# Patient Record
Sex: Female | Born: 1985 | Race: White | Hispanic: No | Marital: Single | Smoking: Former smoker
Health system: Southern US, Community
[De-identification: ages and names within clinical notes are randomized; demographics above are authoritative.]

## PROBLEM LIST (undated history)

## (undated) DIAGNOSIS — F988 Other specified behavioral and emotional disorders with onset usually occurring in childhood and adolescence: Secondary | ICD-10-CM

## (undated) DIAGNOSIS — K219 Gastro-esophageal reflux disease without esophagitis: Secondary | ICD-10-CM

## (undated) HISTORY — DX: Gastro-esophageal reflux disease without esophagitis: K21.9

## (undated) HISTORY — PX: TONSILLECTOMY: SUR1361

## (undated) HISTORY — DX: Other specified behavioral and emotional disorders with onset usually occurring in childhood and adolescence: F98.8

---

## 2006-10-09 ENCOUNTER — Emergency Department (HOSPITAL_COMMUNITY): Admission: EM | Admit: 2006-10-09 | Discharge: 2006-10-09 | Payer: Self-pay | Admitting: Emergency Medicine

## 2009-09-03 ENCOUNTER — Encounter (INDEPENDENT_AMBULATORY_CARE_PROVIDER_SITE_OTHER): Payer: Self-pay | Admitting: *Deleted

## 2009-09-17 ENCOUNTER — Telehealth: Payer: Self-pay | Admitting: Gastroenterology

## 2009-09-22 ENCOUNTER — Encounter: Payer: Self-pay | Admitting: Gastroenterology

## 2009-09-22 ENCOUNTER — Encounter: Payer: Self-pay | Admitting: Nurse Practitioner

## 2009-09-22 ENCOUNTER — Ambulatory Visit: Payer: Self-pay | Admitting: Internal Medicine

## 2009-09-22 DIAGNOSIS — R634 Abnormal weight loss: Secondary | ICD-10-CM | POA: Insufficient documentation

## 2009-09-22 DIAGNOSIS — R112 Nausea with vomiting, unspecified: Secondary | ICD-10-CM | POA: Insufficient documentation

## 2009-09-22 DIAGNOSIS — R519 Headache, unspecified: Secondary | ICD-10-CM | POA: Insufficient documentation

## 2009-09-22 DIAGNOSIS — R197 Diarrhea, unspecified: Secondary | ICD-10-CM | POA: Insufficient documentation

## 2009-09-22 DIAGNOSIS — R1084 Generalized abdominal pain: Secondary | ICD-10-CM | POA: Insufficient documentation

## 2009-09-22 DIAGNOSIS — K219 Gastro-esophageal reflux disease without esophagitis: Secondary | ICD-10-CM | POA: Insufficient documentation

## 2009-09-22 DIAGNOSIS — R51 Headache: Secondary | ICD-10-CM | POA: Insufficient documentation

## 2009-09-22 LAB — CONVERTED CEMR LAB: Tissue Transglutaminase Ab, IgA: 9.9 units (ref <20)

## 2009-09-24 ENCOUNTER — Encounter: Payer: Self-pay | Admitting: Nurse Practitioner

## 2009-09-28 ENCOUNTER — Telehealth: Payer: Self-pay | Admitting: Nurse Practitioner

## 2009-09-29 ENCOUNTER — Telehealth: Payer: Self-pay | Admitting: Gastroenterology

## 2009-10-02 ENCOUNTER — Ambulatory Visit (HOSPITAL_COMMUNITY): Admission: RE | Admit: 2009-10-02 | Discharge: 2009-10-02 | Payer: Self-pay | Admitting: Gastroenterology

## 2009-10-07 ENCOUNTER — Ambulatory Visit: Payer: Self-pay | Admitting: Gastroenterology

## 2009-10-12 ENCOUNTER — Encounter (INDEPENDENT_AMBULATORY_CARE_PROVIDER_SITE_OTHER): Payer: Self-pay | Admitting: *Deleted

## 2009-10-15 ENCOUNTER — Encounter (INDEPENDENT_AMBULATORY_CARE_PROVIDER_SITE_OTHER): Payer: Self-pay | Admitting: *Deleted

## 2009-10-19 ENCOUNTER — Telehealth: Payer: Self-pay | Admitting: Gastroenterology

## 2009-10-19 ENCOUNTER — Ambulatory Visit: Payer: Self-pay | Admitting: Gastroenterology

## 2009-10-27 ENCOUNTER — Ambulatory Visit: Payer: Self-pay | Admitting: Gastroenterology

## 2009-11-04 ENCOUNTER — Ambulatory Visit: Payer: Self-pay | Admitting: Gastroenterology

## 2009-11-05 ENCOUNTER — Ambulatory Visit: Payer: Self-pay | Admitting: Cardiology

## 2009-11-05 LAB — CONVERTED CEMR LAB
ALT: 12 units/L (ref 0–35)
AST: 18 units/L (ref 0–37)
Basophils Absolute: 0 10*3/uL (ref 0.0–0.1)
Creatinine, Ser: 0.7 mg/dL (ref 0.4–1.2)
Eosinophils Absolute: 0 10*3/uL (ref 0.0–0.7)
HCT: 42.4 % (ref 36.0–46.0)
Hemoglobin: 14.8 g/dL (ref 12.0–15.0)
Lymphs Abs: 1.8 10*3/uL (ref 0.7–4.0)
MCHC: 34.9 g/dL (ref 30.0–36.0)
MCV: 92.3 fL (ref 78.0–100.0)
Monocytes Absolute: 0.3 10*3/uL (ref 0.1–1.0)
Neutro Abs: 2 10*3/uL (ref 1.4–7.7)
RDW: 12.9 % (ref 11.5–14.6)
Sodium: 141 meq/L (ref 135–145)
Total Bilirubin: 0.5 mg/dL (ref 0.3–1.2)

## 2009-12-08 ENCOUNTER — Encounter: Payer: Self-pay | Admitting: Gastroenterology

## 2010-02-02 NOTE — Letter (Signed)
Summary: [facility] Instructions  [facility]  [address] [place], [place] [address]   Phone: [phone]  Fax: [phone]       Carol Cain    08-20-1985    MRN: [id number]        Procedure Day /Date:  Tuesday 10/27/2009     Arrival Time: 10:00 am     Procedure Time: 11:00 am     Location of Procedure:                    _x _  [facility] (4th Floor)                        PREPARATION FOR COLONOSCOPY WITH MOVIPREP   Starting 5 days prior to your procedure Thursday 10/20 do not eat nuts, seeds, popcorn, corn, beans, peas,  salads, or any raw vegetables.  Do not take any fiber supplements (e.g. Metamucil, Citrucel, and Benefiber).  THE DAY BEFORE YOUR PROCEDURE         DATE: Monday 10/24  1.  Drink clear liquids the entire day-NO SOLID FOOD  2.  Do not drink anything colored red or purple.  Avoid juices with pulp.  No orange juice.  3.  Drink at least 64 oz. (8 glasses) of fluid/clear liquids during the day to prevent dehydration and help the prep work efficiently.  CLEAR LIQUIDS INCLUDE: Water Jello Ice Popsicles Tea (sugar ok, no milk/cream) Powdered fruit flavored drinks Coffee (sugar ok, no milk/cream) Gatorade Juice: apple, white grape, white cranberry  Lemonade Clear bullion, consomm, broth Carbonated beverages (any kind) Strained chicken noodle soup Hard Candy                             4.  In the morning, mix first dose of MoviPrep solution:    Empty 1 Pouch A and 1 Pouch B into the disposable container    Add lukewarm drinking water to the top line of the container. Mix to dissolve    Refrigerate (mixed solution should be used within 24 hrs)  5.  Begin drinking the prep at 5:00 p.m. The MoviPrep container is divided by 4 marks.   Every 15 minutes drink the solution down to the next mark (approximately 8 oz) until the full liter is complete.   6.  Follow completed prep with 16 oz of clear liquid of your choice  (Nothing red or purple).  Continue to drink clear liquids until bedtime.  7.  Before going to bed, mix second dose of MoviPrep solution:    Empty 1 Pouch A and 1 Pouch B into the disposable container    Add lukewarm drinking water to the top line of the container. Mix to dissolve    Refrigerate  THE DAY OF YOUR PROCEDURE      DATE: Thursday 10/25  Beginning at 6:00 a.m. (5 hours before procedure):         1. Every 15 minutes, drink the solution down to the next mark (approx 8 oz) until the full liter is complete.  2. Follow completed prep with 16 oz. of clear liquid of your choice.    3. You may drink clear liquids until 9:00 am (2 HOURS BEFORE PROCEDURE).   MEDICATION INSTRUCTIONS  Unless otherwise instructed, you should take regular prescription medications with a small sip of water   as early as possible the morning of your  procedure.           OTHER INSTRUCTIONS  You will need a responsible adult at least 25 years of age to accompany you and drive you home.   This person must remain in the waiting room during your procedure.  Wear loose fitting clothing that is easily removed.  Leave jewelry and other valuables at home.  However, you may wish to bring a book to read or  an iPod/MP3 player to listen to music as you wait for your procedure to start.  Remove all body piercing jewelry and leave at home.  Total time from sign-in until discharge is approximately 2-3 hours.  You should go home directly after your procedure and rest.  You can resume normal activities the  day after your procedure.  The day of your procedure you should not:   Drive   Make legal decisions   Operate machinery   Drink alcohol   Return to work  You will receive specific instructions about eating, activities and medications before you leave.    The above instructions have been reviewed and explained to me by   Ezra Sites RN  October 19, 2009 1:16 PM   I fully understand and  can verbalize these instructions _____________________________ Date _________

## 2010-02-02 NOTE — Letter (Signed)
Summary: Out of [facility]  [address] [place], [place] [address]   Phone: [phone]  Fax: [phone]    September 22, 2009   Student:  Maurine Minister    To Whom It May Concern:   For Medical reasons, please excuse the above named student from school for the following dates:  Start:   September 22, 2009  This Student saw Willette Cluster RNP at [facility].     If you need additional information, please feel free to contact our office.   Sincerely,           ****This is a legal document and cannot be tampered with.  Schools are authorized to verify all information and to do so accordingly.

## 2010-02-02 NOTE — Letter (Signed)
Summary: Previsit letter  [facility]  [address] [place], [place] [address]   Phone: [phone]  Fax: [phone]       10/12/2009 MRN: [id number]  Carol Cain [address] [place], [place]  [address]  Dear Ms. Picking,  Welcome to the Gastroenterology Division at [facility].    You are scheduled to see a nurse for your pre-procedure visit on 10/19/09 at 1pm  on the 3rd floor at [facility], [address]  We ask that you try to arrive at our office 15 minutes prior to your appointment time to allow for check-in.  Your nurse visit will consist of discussing your medical and surgical history, your immediate family medical history, and your medications.    Please bring a complete list of all your medications or, if you prefer, bring the medication bottles and we will list them.  We will need to be aware of both prescribed and over the counter drugs.  We will need to know exact dosage information as well.  If you are on blood thinners (Coumadin, Plavix, Aggrenox, Ticlid, etc.) please call our office today/prior to your appointment, as we need to consult with your physician about holding your medication.   Please be prepared to read and sign documents such as consent forms, a financial agreement, and acknowledgement forms.  If necessary, and with your consent, a friend or relative is welcome to sit-in on the nurse visit with you.  Please bring your insurance card so that we may make a copy of it.  If your insurance requires a referral to see a specialist, please bring your referral form from your primary care physician.  No co-pay is required for this nurse visit.     If you cannot keep your appointment, please call [phone] to cancel or reschedule prior to your appointment date.  This allows [place] the opportunity to schedule an appointment for another patient in need of care.    Thank you for choosing [facility] for your medical  needs.  We appreciate the opportunity to care for you.  Please visit [place] at our website  to learn more about our practice.                     Sincerely.                                                                                                                   The Gastroenterology Division

## 2010-02-02 NOTE — Progress Notes (Signed)
Summary: triage  Phone Note Call from Patient Call back at Home Phone [phone]   Caller: Patient Call For: Dr. Christella Hartigan Reason for Call: Talk to Nurse Summary of Call: pt symptoms have worsened and she would like to be seen sooner Initial call taken by: Vallarie Mare,  September 17, 2009 10:06 AM  Follow-up for Phone Call        Vomiting and diarrhea, all day nausea, abd pain and sweating. x 3 weeks.  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  She has been told not to come to class because of this.  Pt offered appt with Amy on 09/21/09 but she was unable to make this so she was put on with [facility] on 09/22/09. Follow-up by: Chales Abrahams CMA Duncan Dull),  September 17, 2009 10:54 AM

## 2010-02-02 NOTE — Miscellaneous (Signed)
Summary: [facility]  Clinical Lists Changes  Medications: Added new medication of MOVIPREP 100 GM  SOLR (PEG-KCL-NACL-NASULF-NA ASC-C) As per prep instructions. - Signed Rx of MOVIPREP 100 GM  SOLR (PEG-KCL-NACL-NASULF-NA ASC-C) As per prep instructions.;  #1 x 0;  Signed;  Entered by: Ezra Sites RN;  Authorized by: Rachael Fee MD;  Method used: Electronically to [facility]. #[id number]*, [address] [place], [place], [place]  [address], Ph: [phone], Fax: [phone] Observations: Added new observation of NKA: T (10/19/2009 12:57)    Prescriptions: MOVIPREP 100 GM  SOLR (PEG-KCL-NACL-NASULF-NA ASC-C) As per prep instructions.  #1 x 0   Entered by:   Ezra Sites RN   Authorized by:   Rachael Fee MD   Signed by:   Ezra Sites RN on 10/19/2009   Method used:   Electronically to        [facility]. [phone]* (retail)       [phone] [address].       [place], [place]  [address]       Ph: [phone]       Fax: [phone]   RxID:   [id number]

## 2010-02-02 NOTE — Letter (Signed)
Summary: New Patient letter  [facility]  [address] [place], [place] [address]   Phone: [phone]  Fax: [phone]       09/03/2009 MRN: [id number]  Carol Cain [address] [place], [place]  [address]  Dear Ms. Mecca,  Welcome to the Gastroenterology Division at [facility].    You are scheduled to see Dr. Christella Hartigan on 10/16/2009 at 11:00AM on the 3rd floor at [facility], [address].  We ask that you try to arrive at our office 15 minutes prior to your appointment time to allow for check-in.  We would like you to complete the enclosed self-administered evaluation form prior to your visit and bring it with you on the day of your appointment.  We will review it with you.  Also, please bring a complete list of all your medications or, if you prefer, bring the medication bottles and we will list them.  Please bring your insurance card so that we may make a copy of it.  If your insurance requires a referral to see a specialist, please bring your referral form from your primary care physician.  Co-payments are due at the time of your visit and may be paid by cash, check or credit card.     Your office visit will consist of a consult with your physician (includes a physical exam), any laboratory testing he/she may order, scheduling of any necessary diagnostic testing (e.g. x-Carol, ultrasound, CT-scan), and scheduling of a procedure (e.g. Endoscopy, Colonoscopy) if required.  Please allow enough time on your schedule to allow for any/all of these possibilities.    If you cannot keep your appointment, please call [phone] to cancel or reschedule prior to your appointment date.  This allows [place] the opportunity to schedule an appointment for another patient in need of care.  If you do not cancel or reschedule by 5 p.m. the business day prior to your appointment date, you will be charged a $50.00 late cancellation/no-show fee.    Thank you for choosing [facility] for your medical needs.  We appreciate the opportunity to care for you.  Please visit [place] at our website  to learn more about our practice.                     Sincerely,                                                             The Gastroenterology Division

## 2010-02-02 NOTE — Progress Notes (Signed)
Summary: Prescritions to [facility] Note Outgoing Call   Call placed by: Carol Cain,  September 28, 2009 12:14 PM Call placed to: Patient Summary of Call: Called the pt to ask her about taking a home pregnancy test.  She said has very  recently taken 3 tests due to wondering if she was pregnant. She says she is not and it has been 25 days since her last menses.  She expects her period this week.  Carol Cain RNP prescribed today for Cipro 500 BID x 7 days and Flagyl 500  two times a day x 7 days.  Pt wants me to send the prescriptiions to [facility], [employer]. [place] [facility] [place]).  I asked her to please call me the end of the week to let us know how she is doing. Initial call taken by: Carol Cain,  September 28, 2009 12:19 PM  Follow-up for Phone Call        Pregnancy test done very recently x 3.  The pt says she is not pregnant.  She had wondered herself since getting nauseated at certain smells and being nauseated.  She expects her period this week. Follow-up by: Carol Cain,  September 28, 2009 12:23 PM  Additional Follow-up for Phone Call Additional follow up Details #1::        Ok. Celiac panel and stool tests negative but given ongoing diarrhea, frequent exposure to pigs, ducks, etc... will try course of antibiotics for diarrhea. She is scheduled for EGD for the nausea and vomiting.  Additional Follow-up by: Carol Cluster NP,  September 29, 2009 7:42 AM    New/Updated Medications: CIPRO 500 MG TABS (CIPROFLOXACIN HCL) Take 1 tab twice daily x 7 days FLAGYL 500 MG TABS (METRONIDAZOLE) Take 1 tab twice daily x 7 days Prescriptions: FLAGYL 500 MG TABS (METRONIDAZOLE) Take 1 tab twice daily x 7 days  #14 x 0   Entered by:   Lowry Ram NCMA   Authorized by:   Carol Cluster NP   Signed by:   Lowry Ram NCMA on 09/28/2009   Method used:   Electronically to        [facility]. [phone]* (retail)       [phone]  [address].       [place], [place]  [address]       Ph: [phone]       Fax: [phone]   RxID:   [id number] CIPRO 500 MG TABS (CIPROFLOXACIN HCL) Take 1 tab twice daily x 7 days  #14 x 0   Entered by:   Lowry Ram NCMA   Authorized by:   Carol Cluster NP   Signed by:   Lowry Ram NCMA on 09/28/2009   Method used:   Electronically to        [facility]. [phone]* (retail)       [phone] [address].       [place], [place]  [address]       Ph: [phone]       Fax: [phone]   RxID:   [id number]

## 2010-02-02 NOTE — Progress Notes (Signed)
Summary: Gi Issues discuss with nurse.  Phone Note Call from Patient Call back at [facility] Phone [phone]   Call For: Dr Christella Hartigan Reason for Call: Talk to Nurse Summary of Call: Having some GI issues she would like to discuss with nurse. Initial call taken by: Leanor Kail [facility],  October 19, 2009 1:26 PM  Follow-up for Phone Call        left message on machine to call back Chales Abrahams CMA Duncan Dull)  October 19, 2009 1:29 PM   pt having rectal bleeding/ burning  with every bowel movement  .  Also, having blisters in the mouth. Having Colon on 10/27/09.  Pt has been doing sitz baths daily as much as possible but she is worried now because of the blisters she is not sure if this is related or not. Advised to keep colon appt and to call if new symptoms arise.  Dr Juanda Chance please advise if anything further needs to be done Follow-up by: Chales Abrahams CMA Duncan Dull),  October 19, 2009 2:04 PM  Additional Follow-up for Phone Call Additional follow up Details #1::        Rectak blisters likely not related to mouth blisters unless she has Type II Herpes..Suggest to use Nupricainal ointment three times a day and as needed rectal irritation. Dr Christella Hartigan will chech the lesions at colonoscopy. Additional Follow-up by: Hart Carwin MD,  October 19, 2009 4:43 PM    Additional Follow-up for Phone Call Additional follow up Details #2::    pt aware Follow-up by: Chales Abrahams CMA Duncan Dull),  October 19, 2009 4:53 PM

## 2010-02-02 NOTE — Procedures (Signed)
Summary: Upper Endoscopy  Patient: Carol Cain Note: All result statuses are Final unless otherwise noted.  Tests: (1) Upper Endoscopy (EGD)   EGD Upper Endoscopy       DONE     [facility]     [address]     [place], [place]  [address]           ENDOSCOPY PROCEDURE REPORT           PATIENT:  Carol Cain, Carol Cain  MR#:  [id number]     BIRTHDATE:  May 21, 1985, 23 yrs. old  GENDER:  female     ENDOSCOPIST:  Rachael Fee, MD     PROCEDURE DATE:  10/07/2009     PROCEDURE:  EGD with biopsy     ASA CLASS:  Class I     INDICATIONS:  nausea, vomiting, abd pain, diarrhea     MEDICATIONS:   Fentanyl 50 mcg IV, Versed 7 mg IV     TOPICAL ANESTHETIC:  Exactacain Spray     DESCRIPTION OF PROCEDURE:   After the risks benefits and     alternatives of the procedure were thoroughly explained, informed     consent was obtained.  The [facility] GIF-H180 [id number] endoscope was     introduced through the mouth and advanced to the second portion of     the duodenum, without limitations.  The instrument was slowly     withdrawn as the mucosa was fully examined.     <<PROCEDUREIMAGES>>     The upper, middle, and distal third of the esophagus were     carefully inspected and no abnormalities were noted. The z-line     was well seen at the GEJ. The endoscope was pushed into the fundus     which was normal including a retroflexed view. The antrum,gastric     body, first and second part of the duodenum were unremarkable.     Biopsies were taken from normal duodenum to check for Celiac Sprue     (pathology jar 1) (see image1, image2, image3, and image4).     Retroflexed views revealed no abnormalities.    The scope was then     withdrawn from the patient and the procedure completed.     COMPLICATIONS:  None           ENDOSCOPIC IMPRESSION:     1) Normal EGD; duodenum biopsied to check for Celiac Sprue     changes.           RECOMMENDATIONS:     Await final biopsy report.     If negative for celiac  sprue, would proceed with colonoscopy     (normal CBC, cmet, stool studies, no response to empiric trial of     Abx).           ______________________________     Rachael Fee, MD           n.     eSIGNED:   Rachael Fee at 10/07/2009 11:07 AM           Maurine Minister, [phone]  Note: An exclamation mark (!) indicates a result that was not dispersed into the flowsheet. Document Creation Date: 10/07/2009 11:07 AM _______________________________________________________________________  (1) Order result status: Final Collection or observation date-time: 10/07/2009 11:03 Requested date-time:  Receipt date-time:  Reported date-time:  Referring Physician:   Ordering Physician: Rob Bunting [id number]) Specimen Source:  Source: Launa Grill Order Number: [id number] Lab site:

## 2010-02-02 NOTE — Assessment & Plan Note (Signed)
Summary: diarrhea, vomiting, nausea, abd pain x 3 weeks/pl   History of Present Illness Visit Type: Initial Visit Primary GI MD: Rob Bunting MD Primary Provider: [facility] Requesting Provider: n/a Chief Complaint: Severe stomach cramps, diarhea and vomiting History of Present Illness:   Patient is a 25 year old female here for initial evaluation of multiple GI issues.  In addition she has multiple generalized complaints including, but not limited to, headaches, shortness of breath, night sweats, lower extremity swelling, cough, depression, fatigue, back pain, confusion, anxiety, and muscle pains. Patient accompanied by boyfriend who helps provide history. Has had a "sensitive stomach" (nausea, postprandial defection) since at least 2007when she was a vegan, which she no longer is. Her main problems began last summer and have progressed over the last few weeks. She complains of nausea, vomiting, diarrhea with urgency, frequent stomach cramps and weight loss of 30 pounds. Vomitus and stools contain excessive mucous. Recently started Lomotil and BMs have decreased from 5-6 a day to two BMs a day. Her abdominal cramps are diffuse and not relieved with defecation.  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 Worried about bacterial infections. Sometimes sees blood on tissue but anus is excoriated.   Evaluated for diarrhea at [facility] on 09/02/09, records reviewed and thyroid studies, CMP CBC all normal.   Longstanding GERD, takes Zantac three times a week.    GI Review of Systems    Reports abdominal pain, acid reflux, belching, bloating, chest pain, heartburn, loss of appetite, nausea, vomiting, and  weight loss.     Location of  Abdominal pain: gernalized.    Denies dysphagia with liquids, dysphagia with solids, vomiting blood, and  weight gain.      Reports change in bowel habits,  diarrhea, and  fecal incontinence.     Denies anal fissure, black tarry stools, constipation, diverticulosis, heme positive stool, hemorrhoids, irritable bowel syndrome, jaundice, light color stool, liver problems, rectal bleeding, and  rectal pain. Preventive Screening-Counseling & Management  Alcohol-Tobacco     Smoking Status: quit      Drug Use:  no.      Current Medications (verified): 1)  Zoloft 100 Mg Tabs (Sertraline Hcl) .Marland Kitchen.. 1 and 1/2 By Mouth in The Am 2)  Lomotil 2.5-0.025 Mg Tabs (Diphenoxylate-Atropine) .... 2 By Mouth Then 1 Tablet Every 8 Hours As Needed 3)  Ondansetron Hcl 4 Mg Tabs (Ondansetron Hcl) .... As Needed  Allergies (verified): No Known Drug Allergies  Past History:  Past Medical History: Anemia Anxiety Disorder Chronic Headaches Depression GERD Seizures Urinary Tract Infection  Past Surgical History: Unremarkable  Family History: No FH of Colon Cancer: Family History of Colon Polyps: Family History of Irritable Bowel Syndrome:  Social History: single student Patient is a former smoker.  Alcohol Use - no Daily Caffeine Use Illicit Drug Use - no Smoking Status:  quit Drug Use:  no  Review of Systems       The patient complains of allergy/sinus, anxiety-new, back pain, breast changes/lumps, confusion, cough, depression-new, fatigue, fever, headaches-new, menstrual pain, muscle pains/cramps, night sweats, shortness of breath, swelling of feet/legs, thirst - excessive, and urination - excessive.  The patient denies anemia, arthritis/joint pain, blood in urine, change in vision, coughing up blood, fainting, hearing problems, heart murmur, heart rhythm changes, itching, nosebleeds, skin rash, sleeping problems, sore throat, swollen lymph glands, thirst - excessive , urination -  excessive , urination changes/pain, urine leakage, vision changes, and voice change.    Vital Signs:  Patient profile:   25 year old female Height:      71  inches Weight:      173 pounds BMI:     24.22 BSA:     1.98 Pulse rate:   58 / minute Pulse rhythm:   regular BP sitting:   100 / 78  (left arm)  Vitals Entered By: Merri Ray CMA Duncan Dull) (September 22, 2009 1:40 PM)  Physical Exam  General:  Well developed, well nourished, no acute distress. Head:  Normocephalic and atraumatic. Eyes:  Conjunctiva pink, no icterus.  Mouth:  No oral lesions. Tongue moist.  Neck:  no obvious masses  Lungs:  Clear throughout to auscultation. Heart:  Regular rate and rhythm; no murmurs, rubs,  or bruits. Abdomen:  Abdomen soft, nondistended. Mild-moderate diffuse tenderness. No obvious masses or hepatomegaly.Normal bowel sounds.  Rectal:  A few superificial skin splits around anus. No other external or internal lesions. Scant stool in vaultl, heme negative Msk:  Symmetrical with no gross deformities. Normal posture. Extremities:  No palmar erythema, no edema.  Neurologic:  Alert and  oriented x4;  grossly normal neurologically. Skin:  Intact without significant lesions or rashes. Cervical Nodes:  No significant cervical adenopathy. Psych:  Alert and cooperative. Normal mood and affect.   Impression & Recommendations:  Problem # 1:  NAUSEA AND VOMITING (ICD-787.01) Assessment New Nausea, vomiting, diarrhea, abdominal cramps and excessive weight loss since June. Normal CBC, CMP within last two weeks. Patient looks fine, nothing terribly concerning on abdominal exam. 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 Rule out intestinal parasite. Rule of celiac disease ([place] ancestry). Her symptoms may be all functional. Diarrhea isn't profuse, suspect nausea and vomiting mostly responsible for excessive weight loss. For further evaluation the patient will be scheduled for an EGD with biopsies ( if indicated).  The risks and benefits of the procedure, as well as alternatives were discussed with the patient and she agrees to proceed. If above negative and symptoms  persist, may need colonscopy.    Orders: T-Sprue Panel (Celiac Disease Aby Eval) (83516x3/[address]) T-Culture, Stool (87045/87046-70140) T-Stool for O&P (16109-60454) T-Fecal WBC (09811-91478) EGD (EGD)  Problem # 2:  DIARRHEA (ICD-787.91) See #1.  Problem # 3:  ABDOMINAL PAIN -GENERALIZED (ICD-789.07) Assessment: New See #1. Orders: T-Sprue Panel (Celiac Disease Aby Eval) (83516x3/[address]) T-Culture, Stool (87045/[id number]) T-Stool for O&P (29562-13086) T-Fecal WBC (57846-96295) EGD (EGD)  Problem # 4:  GERD (ICD-530.81) Assessment: Comment Only Takes Zantac as needed. Orders: T-Sprue Panel (Celiac Disease Aby Eval) (83516x3/[address]) T-Culture, Stool (87045/87046-70140) T-Stool for O&P (28413-24401) T-Fecal WBC (02725-36644) EGD (EGD)  Problem # 5:  HEADACHE (ICD-784.0) Assessment: Comment Only Multiple generalized complaints listed in HPI. Will defer workup to PCP.  Patient Instructions: 1)  Please go to lab, basement level. 2)  We have given you samples of Prilosec 20 MG ( Omeprazole). 3)  We sent a prescription to your pharmacy [facility]. 4)  We scheduled the Endoscopy with Dr Marina Goodell for 10-07-09. 5)  Directions and brochure provided. 6)  [facility] Patient Information Guide given to patient. 7)  Copy sent to : [facility] 8)  The medication list was reviewed and reconciled.  All changed / newly prescribed medications were explained.  A complete medication list was provided to the patient / caregiver. Prescriptions: OMEPRAZOLE 20 MG CPDR (OMEPRAZOLE) Take 1 cap 30 min before breakfast  #30 x 3  Entered by:   Lowry Ram NCMA   Authorized by:   Willette Cluster NP   Signed by:   Lowry Ram NCMA on 09/22/2009   Method used:   Electronically to        [facility]. [phone]* (retail)       [phone]       [place], [place]  [address]       Ph: [phone]       Fax:  [phone]   RxID:   [id number] ONDANSETRON HCL 4 MG TABS (ONDANSETRON HCL) Take 1 tab every 4-6 hours as needed for nausea  #45 x 0   Entered by:   Lowry Ram NCMA   Authorized by:   Willette Cluster NP   Signed by:   Lowry Ram NCMA on 09/22/2009   Method used:   Electronically to        [facility]. [phone]* (retail)       [phone] [address]       [place], [place]  [address]       Ph: [phone]       Fax: [phone]   RxID:   [id number]

## 2010-02-02 NOTE — Procedures (Signed)
Summary: Colonoscopy  Patient: Carol Cain Note: All result statuses are Final unless otherwise noted.  Tests: (1) Colonoscopy (COL)   COL Colonoscopy           DONE     [facility]     [address]     [place], [place]  [address]           COLONOSCOPY PROCEDURE REPORT           PATIENT:  [name], [name]  MR#:  [id number]     BIRTHDATE:  Sep 21, 1985, 24 yrs. old  GENDER:  female     ENDOSCOPIST:  Rachael Fee, MD     PROCEDURE DATE:  10/27/2009     PROCEDURE:  Colonoscopy with biopsy     ASA CLASS:  Class I     INDICATIONS:  nausea, vomiting, abd pain, diarrhea (recent EGD,     stool tests, cmet, cbc, [place] were all normal)     MEDICATIONS:   Fentanyl 100 mcg IV, Versed 10 mg IV           DESCRIPTION OF PROCEDURE:   After the risks benefits and     alternatives of the procedure were thoroughly explained, informed     consent was obtained.  Digital rectal exam was performed and     revealed no rectal masses.   The LB [id number] endoscope     was introduced through the anus and advanced to the terminal ileum     which was intubated for a short distance, without limitations.     The quality of the prep was good, using MoviPrep.  The instrument     was then slowly withdrawn as the colon was fully examined.     <<PROCEDUREIMAGES>>     FINDINGS:  Terminal ileum had several shallow, linear ulcerations     however no other clear inflammatory changes. This was biopsied and     sent to pathology (jar 1) (see image3 and image4).  This was     otherwise a normal examination of the colon. Colon was randomly     biopsied (pathology jar 2) to check for microscopic colitis (see     image1 and image5).   Retroflexed views in the rectum revealed no     abnormalities.    The scope was then withdrawn from the patient     and the procedure completed.     COMPLICATIONS:  None           ENDOSCOPIC IMPRESSION:     1) Abnormal mucosa in terminal ileum, biopsied to check for  IBD.     Could be NSAID related.     2) Otherwise normal examination           RECOMMENDATIONS:     Await biopsy results.     For now, please start taking a single daily immodium shortly     after waking up and only stop this if you become constipated.           ______________________________     Rachael Fee, MD           n.     eSIGNED:   Rachael Fee at 10/27/2009 11:31 AM           Maurine Minister, [phone]  Note: An exclamation mark (!) indicates a result that was not dispersed into the flowsheet. Document Creation Date: 10/27/2009 11:32 AM _______________________________________________________________________  (1) Order result status: Final Collection or  observation date-time: 10/27/2009 11:24 Requested date-time:  Receipt date-time:  Reported date-time:  Referring Physician:   Ordering Physician: Rob Bunting [phone]) Specimen Source:  Source: Launa Grill Order Number: [id number] Lab site:

## 2010-02-02 NOTE — Letter (Signed)
Summary: New Patient letter  [facility]  [address] [place], [place] [address]   Phone: [phone]  Fax: [phone]       09/03/2009 MRN: [id number]  Carol Cain [address] [place], [place]  [address]  Dear Ms. Villafranca,  Welcome to the Gastroenterology Division at [facility].    You are scheduled to see Dr. Leone Payor on 10/16/2009 at 11:00AM on the 3rd floor at [facility], [address]  We ask that you try to arrive at our office 15 minutes prior to your appointment time to allow for check-in.  We would like you to complete the enclosed self-administered evaluation form prior to your visit and bring it with you on the day of your appointment.  We will review it with you.  Also, please bring a complete list of all your medications or, if you prefer, bring the medication bottles and we will list them.  Please bring your insurance card so that we may make a copy of it.  If your insurance requires a referral to see a specialist, please bring your referral form from your primary care physician.  Co-payments are due at the time of your visit and may be paid by cash, check or credit card.     Your office visit will consist of a consult with your physician (includes a physical exam), any laboratory testing he/she may order, scheduling of any necessary diagnostic testing (e.g. x-ray, ultrasound, CT-scan), and scheduling of a procedure (e.g. Endoscopy, Colonoscopy) if required.  Please allow enough time on your schedule to allow for any/all of these possibilities.    If you cannot keep your appointment, please call [phone] to cancel or reschedule prior to your appointment date.  This allows [place] the opportunity to schedule an appointment for another patient in need of care.  If you do not cancel or reschedule by 5 p.m. the business day prior to your appointment date, you will be charged a $50.00 late cancellation/no-show fee.    Thank you for choosing  [facility] for your medical needs.  We appreciate the opportunity to care for you.  Please visit [place] at our website  to learn more about our practice.                     Sincerely,                                                             The Gastroenterology Division

## 2010-02-02 NOTE — Miscellaneous (Signed)
Summary: ID referral, pt not answering their calls  Clinical Lists Changes   ID has left several messages on VM without response.

## 2010-02-02 NOTE — Progress Notes (Signed)
Summary: triage  Phone Note Call from Patient Call back at Home Phone [phone]   Caller: Patient Call For: Dr Christella Hartigan Reason for Call: Talk to Nurse Complaint: Breathing Problems Summary of Call: Patient has a lot of pain in the upper part of stomach wants to know what to do. Initial call taken by: Tawni Levy,  September 29, 2009 2:00 PM  Follow-up for Phone Call        I spoke with Willette Cluster regarding the upper abd pain the pt is having and she has ordered her to have a ABD [place].  I will get this scheduled and call the pt with the date and time.  left message on machine to call back Chales Abrahams CMA Duncan Dull)  September 29, 2009 2:18 PM   [place] at [facility] for 10/02/09 11 am NPO 6 hours pt aware. Follow-up by: Chales Abrahams CMA Duncan Dull),  September 29, 2009 2:40 PM  Additional Follow-up for Phone Call Additional follow up Details #1::        Agree

## 2010-02-02 NOTE — Letter (Signed)
Summary: EGD Instructions  [facility]  [address] [place], [place] [address]   Phone: [phone]  Fax: [phone]       ELNER SEIFERT    1985-06-27    MRN: [id number]       Procedure Day /Date:10-07-09     Arrival Time: 10:00 AM      Procedure Time:11:00 AM     Location of Procedure:                    X     [facility] (4th Floor)  PREPARATION FOR ENDOSCOPY   On 10-07-09 THE DAY OF THE PROCEDURE:  1.   No solid foods, milk or milk products are allowed after midnight the night before your procedure.  2.   Do not drink anything colored red or purple.  Avoid juices with pulp.  No orange juice.  3.  You may drink clear liquids until 9:00 AM , which is 2 hours before your procedure.                                                                                                CLEAR LIQUIDS INCLUDE: Water Jello Ice Popsicles Tea (sugar ok, no milk/cream) Powdered fruit flavored drinks Coffee (sugar ok, no milk/cream) Gatorade Juice: apple, white grape, white cranberry  Lemonade Clear bullion, consomm, broth Carbonated beverages (any kind) Strained chicken noodle soup Hard Candy   MEDICATION INSTRUCTIONS  Unless otherwise instructed, you should take regular prescription medications with a small sip of water as early as possible the morning of your procedure.         OTHER INSTRUCTIONS  You will need a responsible adult at least 25 years of age to accompany you and drive you home.   This person must remain in the waiting room during your procedure.  Wear loose fitting clothing that is easily removed.  Leave jewelry and other valuables at home.  However, you may wish to bring a book to read or an iPod/MP3 player to listen to music as you wait for your procedure to start.  Remove all body piercing jewelry and leave at home.  Total time from sign-in until discharge is approximately 2-3 hours.  You should go home directly after your  procedure and rest.  You can resume normal activities the day after your procedure.  The day of your procedure you should not:   Drive   Make legal decisions   Operate machinery   Drink alcohol   Return to work  You will receive specific instructions about eating, activities and medications before you leave.    The above instructions have been reviewed and explained to me by   _______________________    I fully understand and can verbalize these instructions _____________________________ Date _________

## 2010-04-08 ENCOUNTER — Telehealth: Payer: Self-pay | Admitting: Gastroenterology

## 2010-04-08 NOTE — Telephone Encounter (Signed)
LMOM for pt to call back. Hx ECL on 10/27/09 with normal biopsies, followed by normal CT scan on 11/05/09 and normal U/S. 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  Since everything was normal, she was referred to ID services.

## 2010-04-12 NOTE — Telephone Encounter (Signed)
What pt wanted was to have her info faxed to [facility]. She has a new PCP and will be seeing their GI dept. Pt stated she signed a RELEASE OF MEDICAL INFO last week. I checked Dr Christella Hartigan' fax box, but could not find anything. I will leave a note for his CMA. Pt is seeing Dr Ihor Dow.

## 2010-10-02 ENCOUNTER — Emergency Department (HOSPITAL_COMMUNITY)
Admission: EM | Admit: 2010-10-02 | Discharge: 2010-10-03 | Disposition: A | Discharge status: Home / Self Care | Payer: 59 | Attending: Emergency Medicine | Admitting: Emergency Medicine

## 2010-10-02 ENCOUNTER — Emergency Department (HOSPITAL_COMMUNITY): Payer: 59

## 2010-10-02 DIAGNOSIS — R0602 Shortness of breath: Secondary | ICD-10-CM | POA: Insufficient documentation

## 2010-10-02 DIAGNOSIS — J40 Bronchitis, not specified as acute or chronic: Secondary | ICD-10-CM | POA: Insufficient documentation

## 2010-10-02 DIAGNOSIS — R059 Cough, unspecified: Secondary | ICD-10-CM | POA: Insufficient documentation

## 2010-10-02 DIAGNOSIS — R05 Cough: Secondary | ICD-10-CM | POA: Insufficient documentation

## 2010-10-02 DIAGNOSIS — Z79899 Other long term (current) drug therapy: Secondary | ICD-10-CM | POA: Insufficient documentation

## 2010-10-02 DIAGNOSIS — R070 Pain in throat: Secondary | ICD-10-CM | POA: Insufficient documentation

## 2010-10-03 LAB — URINALYSIS, ROUTINE W REFLEX MICROSCOPIC
Ketones, ur: NEGATIVE mg/dL
Leukocytes, UA: NEGATIVE
Nitrite: NEGATIVE
Specific Gravity, Urine: 1.009 (ref 1.005–1.030)
pH: 8.5 — ABNORMAL HIGH (ref 5.0–8.0)

## 2010-10-03 LAB — POCT PREGNANCY, URINE: Preg Test, Ur: NEGATIVE

## 2010-10-03 LAB — POCT I-STAT, CHEM 8
BUN: 8 mg/dL (ref 6–23)
Calcium, Ion: 1.15 mmol/L (ref 1.12–1.32)
Chloride: 104 mEq/L (ref 96–112)
Creatinine, Ser: 0.7 mg/dL (ref 0.50–1.10)
Sodium: 140 mEq/L (ref 135–145)
TCO2: 22 mmol/L (ref 0–100)

## 2010-10-14 LAB — I-STAT 8, (EC8 V) (CONVERTED LAB)
Bicarbonate: 25.7 — ABNORMAL HIGH
Glucose, Bld: 86
Sodium: 140
TCO2: 27
pH, Ven: 7.338 — ABNORMAL HIGH

## 2010-10-14 LAB — DIFFERENTIAL
Basophils Relative: 1
Eosinophils Absolute: 0
Lymphs Abs: 1.5
Neutro Abs: 3.1
Neutrophils Relative %: 61

## 2010-10-14 LAB — CBC
MCV: 92.4
Platelets: 381
WBC: 5

## 2010-10-14 LAB — POCT I-STAT CREATININE: Operator id: 146091

## 2010-10-14 LAB — HCG, QUANTITATIVE, PREGNANCY: hCG, Beta Chain, Quant, S: <2

## 2010-10-14 LAB — POCT PREGNANCY, URINE: Operator id: 151321

## 2011-10-28 IMAGING — US US ABDOMEN COMPLETE
1 series · 14 of 25 positions shown · non-contrast
Comparison: None.

CLINICAL DATA: Abdominal pain, weight loss, nausea and vomiting

ABDOMINAL ULTRASOUND COMPLETE

[Series 1: us abdomen complete · 0.28mm/px · 14 of 57 slices shown]
[im 1/57]
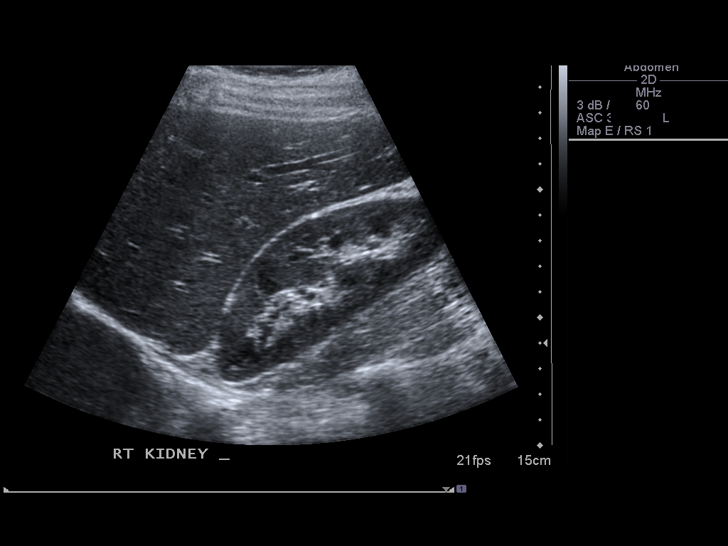
[im 5/57]
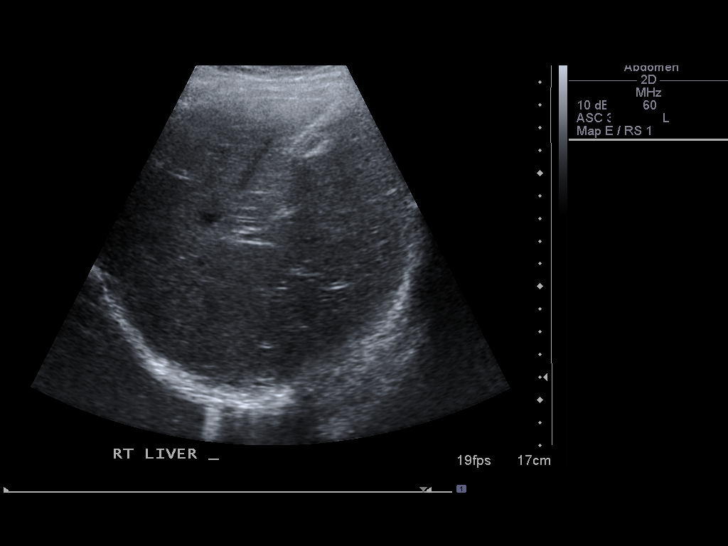
[im 10/57]
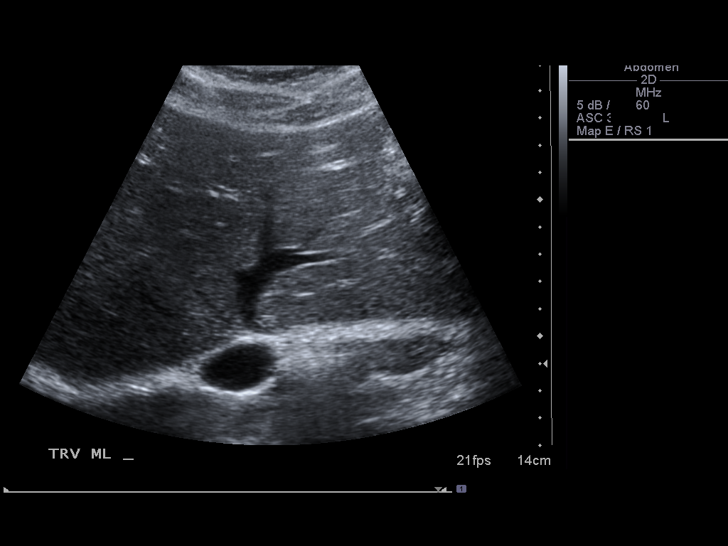
[im 15/57]
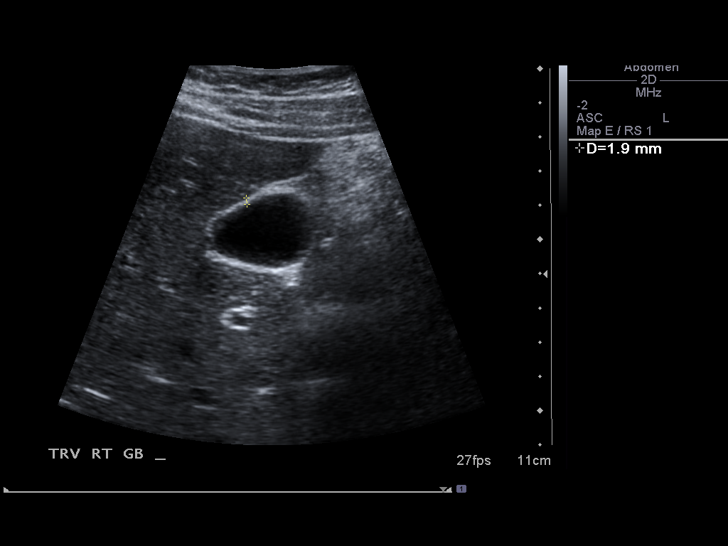
[im 19/57]
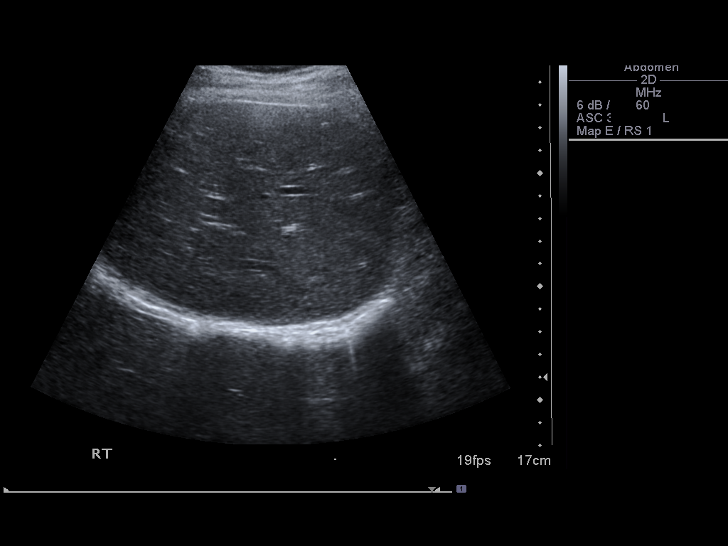
[im 22/57]
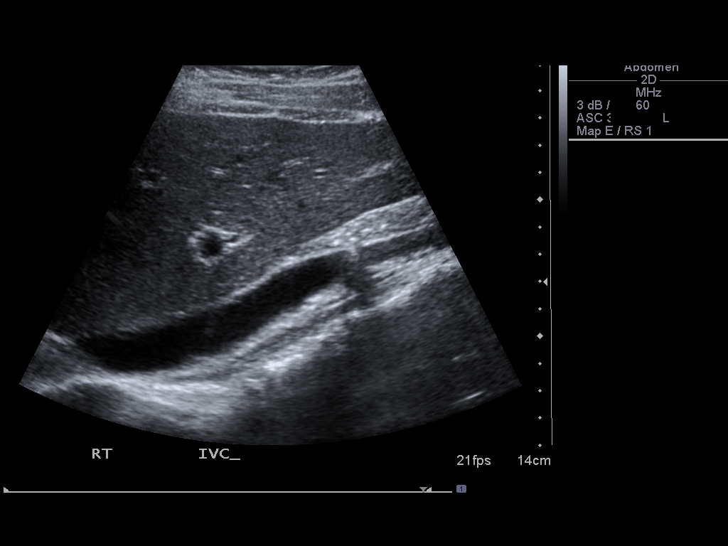
[im 26/57]
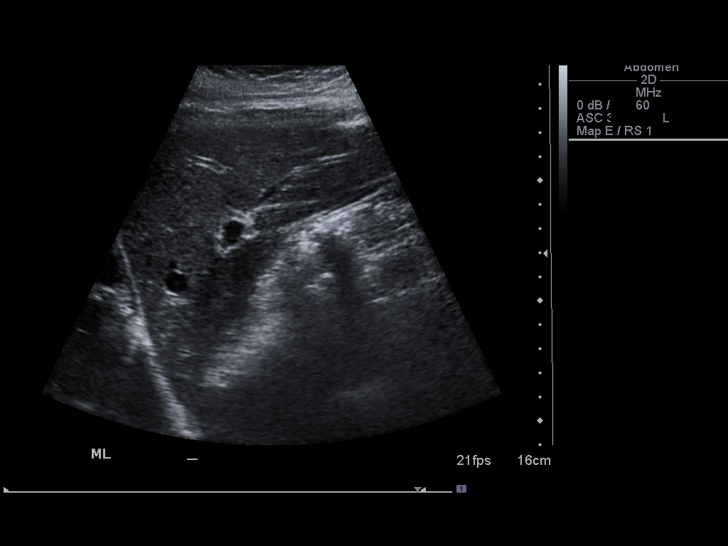
[im 31/57]
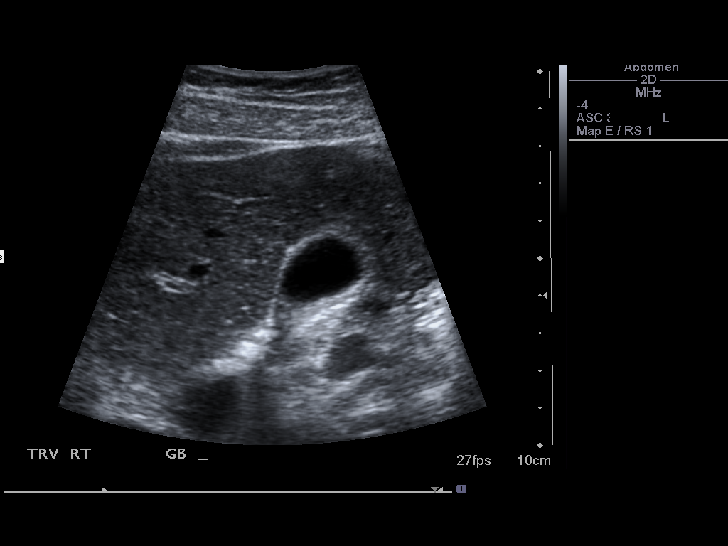
[im 36/57]
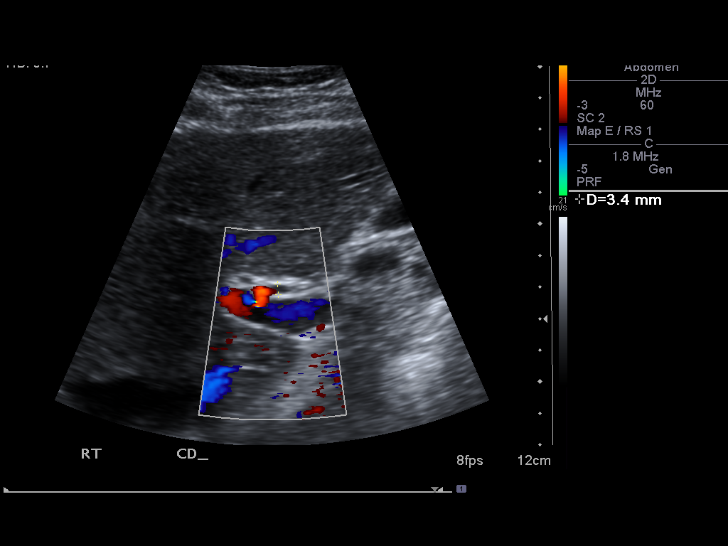
[im 38/57]
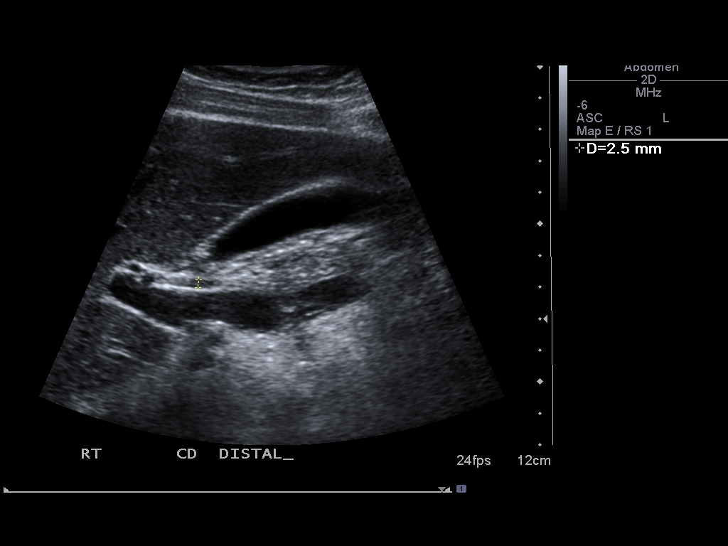
[im 43/57]
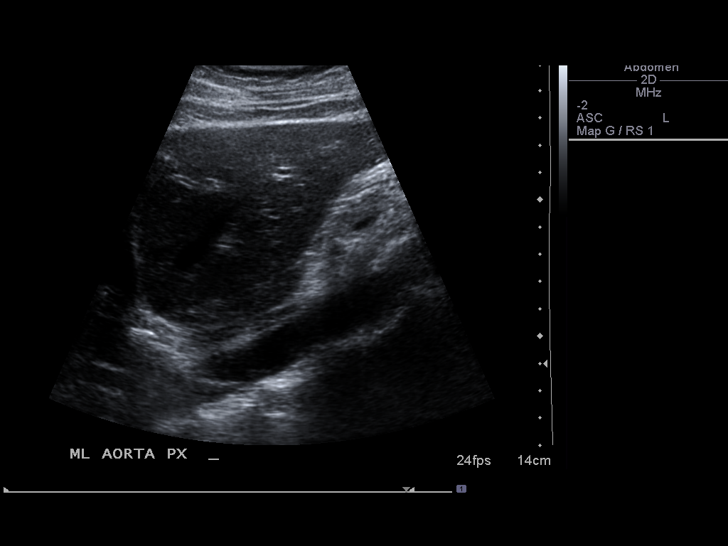
[im 47/57]
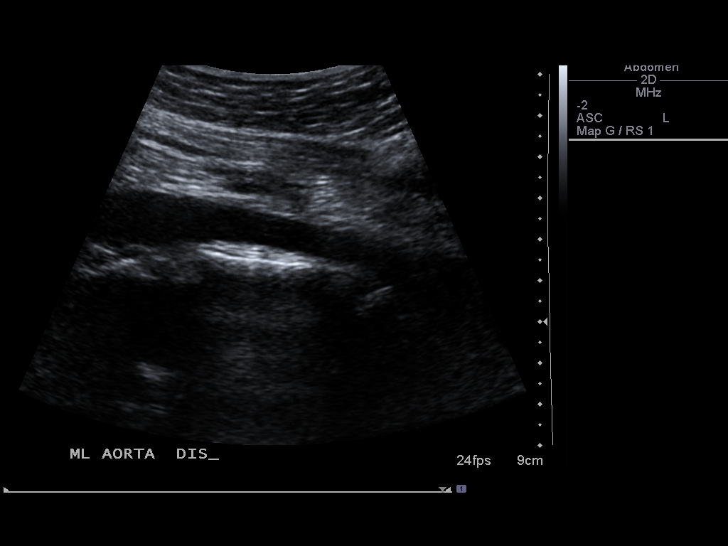
[im 52/57]
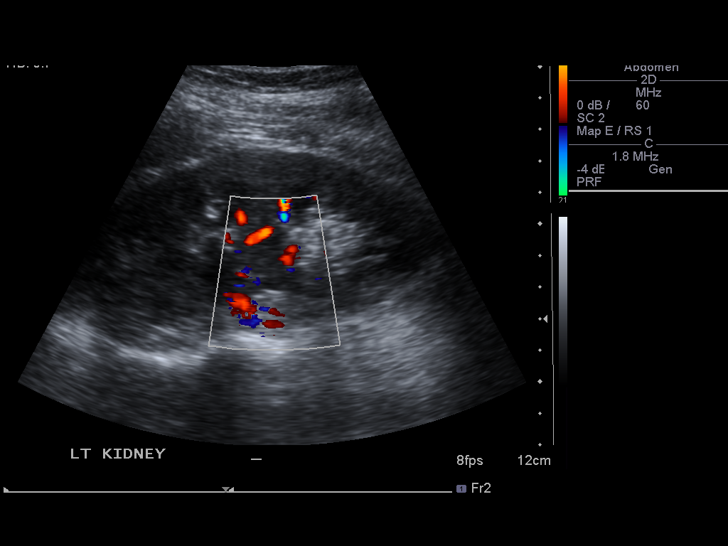
[im 57/57]
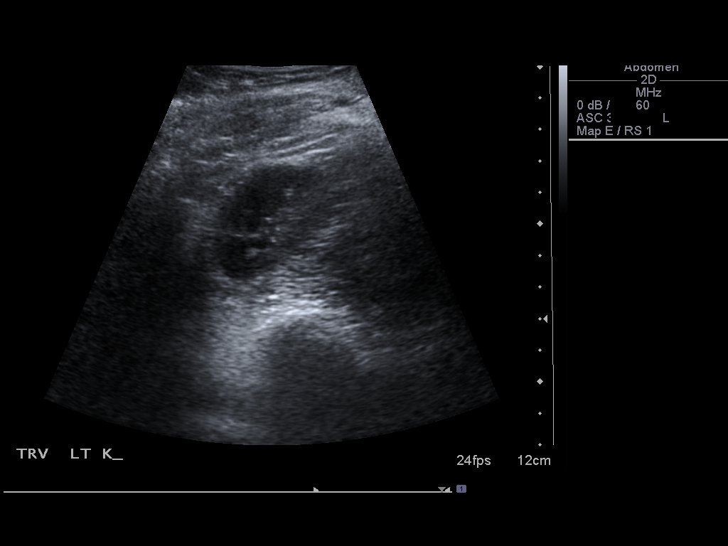

[14 of 25 positions shown; findings below may reference images not displayed]

FINDINGS: Gallbladder:  No gallstones, gallbladder wall thickening, or
pericholecystic fluid.

Common Bile Duct:  Within normal limits in caliber.

Liver: No focal mass lesion identified.  Within normal limits in
parenchymal echogenicity.

IVC:  Appears normal.

Pancreas:  No abnormality identified.

Spleen:  Within normal limits in size and echotexture.

Right kidney:  Normal in size and parenchymal echogenicity.  No
evidence of mass or hydronephrosis.

Left kidney:  Normal in size and parenchymal echogenicity.  No
evidence of mass or hydronephrosis.

Abdominal Aorta:  No aneurysm identified.
IMPRESSION: Negative abdominal ultrasound.

## 2011-12-01 IMAGING — CT CT ABD-PELV W/ CM
2 of 4 series · 17 of 46 positions shown, 19 images · IV contrast (omnipaque)
Comparison: None

CLINICAL DATA: Abdominal pain, weight loss, nausea and vomiting.

CT ABDOMEN AND PELVIS WITH CONTRAST
TECHNIQUE: Multidetector CT imaging of the abdomen and pelvis was
performed following the standard protocol during bolus
administration of intravenous contrast.
Contrast: 100 ml Omnipaque 300 IV.

[Series 2: abd/ pel 5mm · axial · 0.73mm/px · z∈[-513,-73]mm · 14 of 98 slices shown, 16 images]
[im 5/98  soft-tissue]
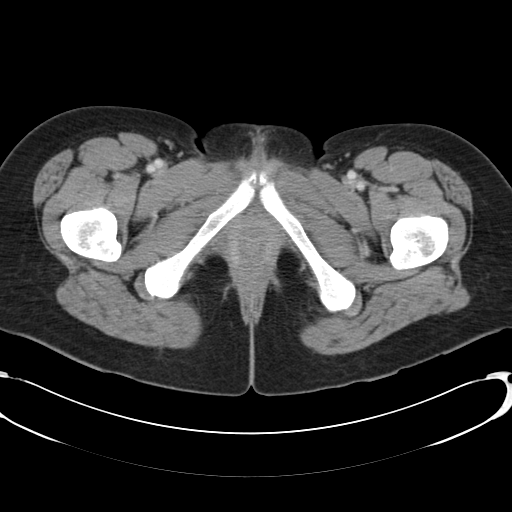
[im 5/98  bone]
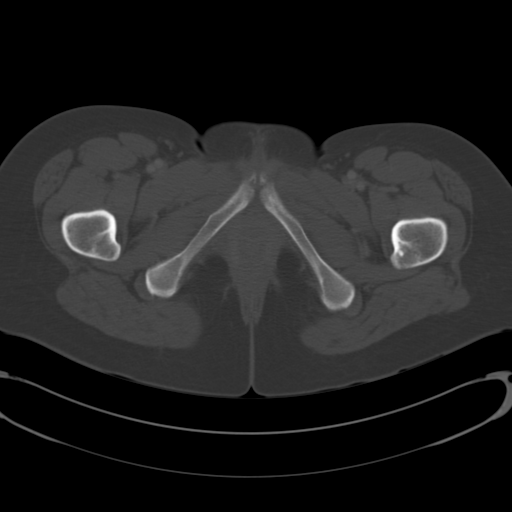
[im 13/98  soft-tissue]
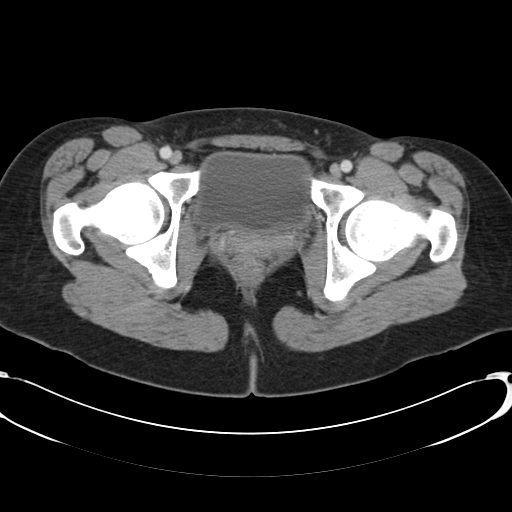
[im 21/98  soft-tissue]
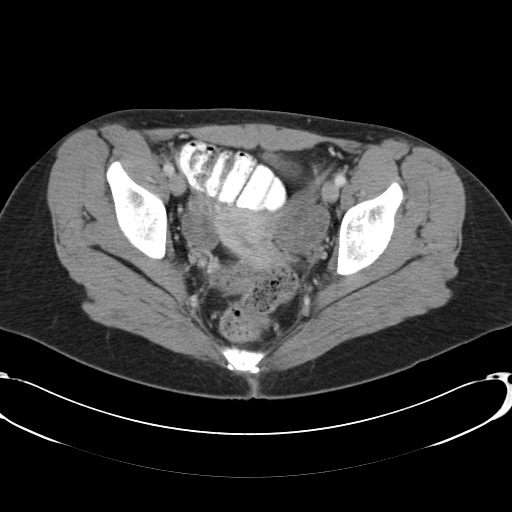
[im 25/98  soft-tissue]
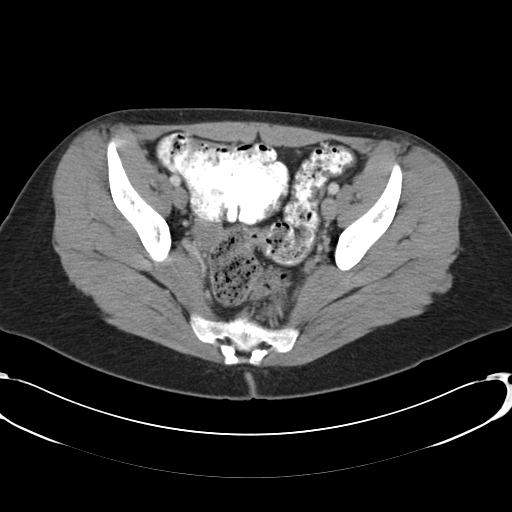
[im 33/98  soft-tissue]
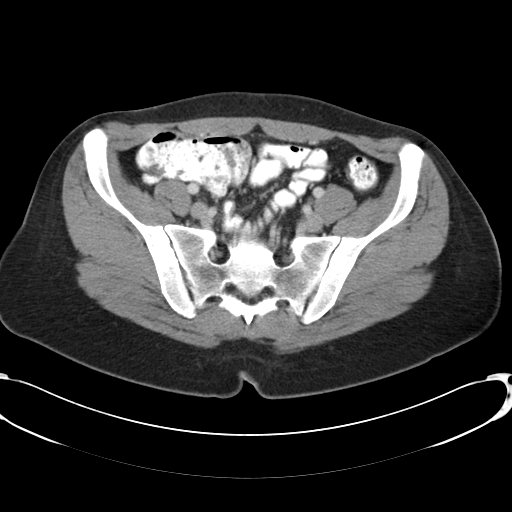
[im 41/98  soft-tissue]
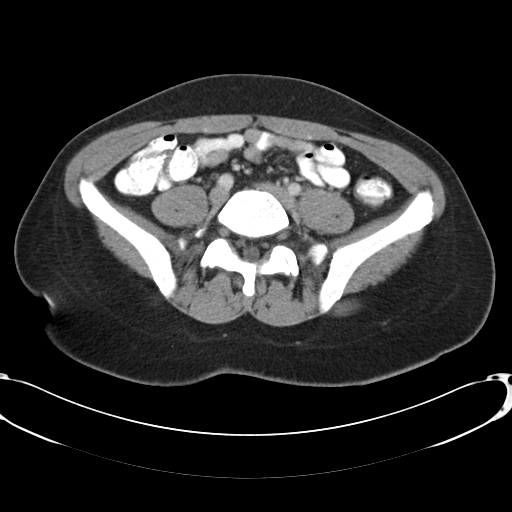
[im 45/98  soft-tissue]
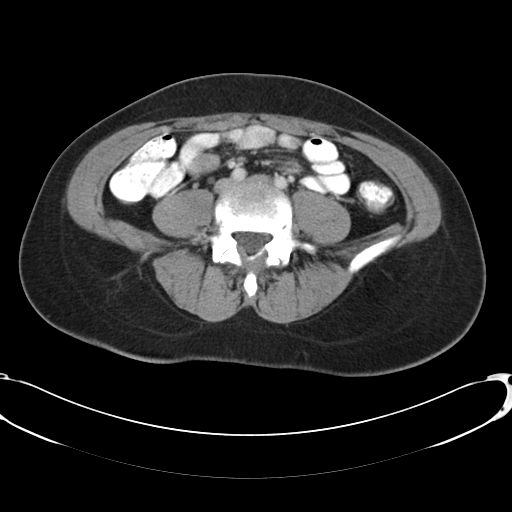
[im 53/98  soft-tissue]
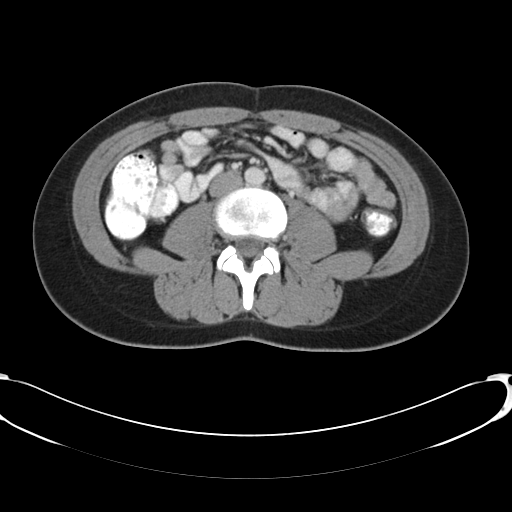
[im 57/98  soft-tissue]
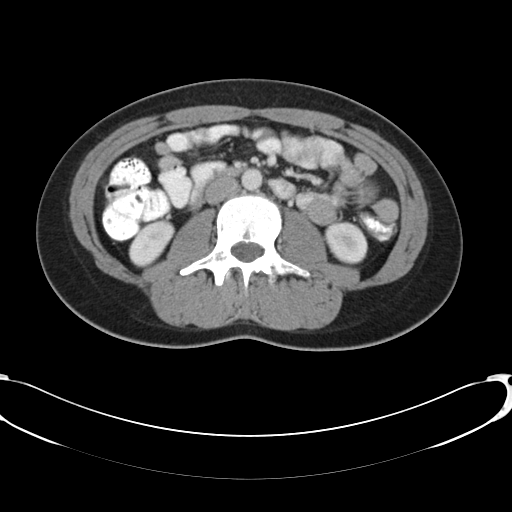
[im 57/98  bone]
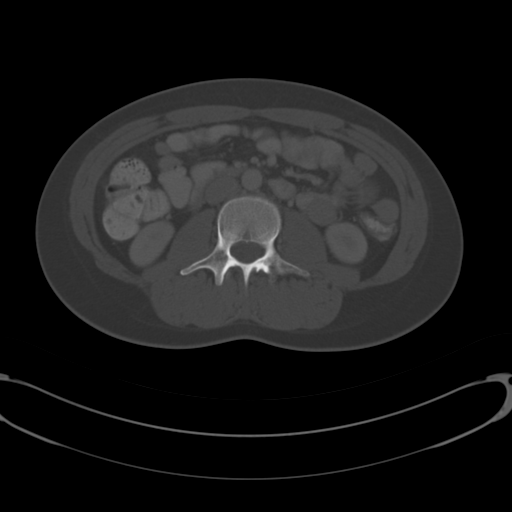
[im 65/98  soft-tissue]
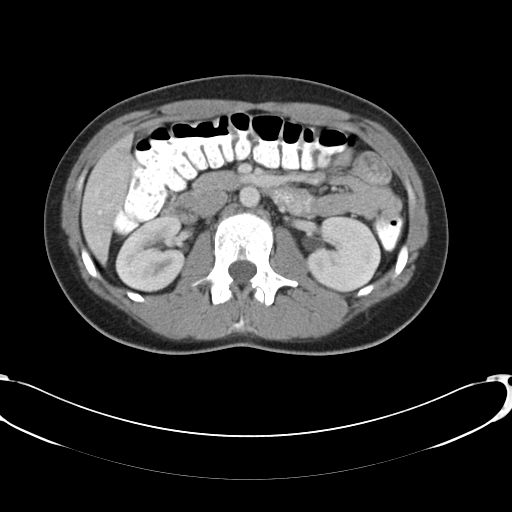
[im 73/98  soft-tissue]
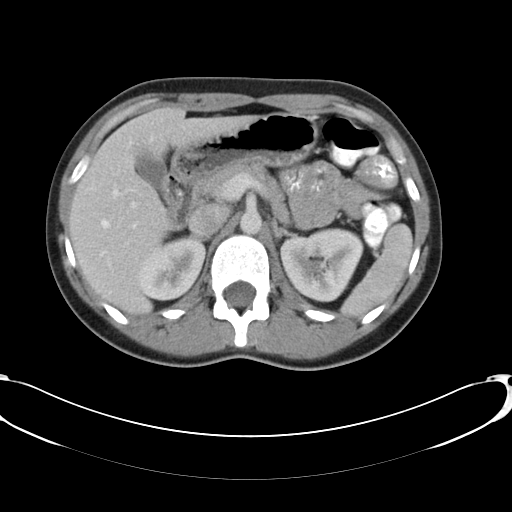
[im 77/98  soft-tissue]
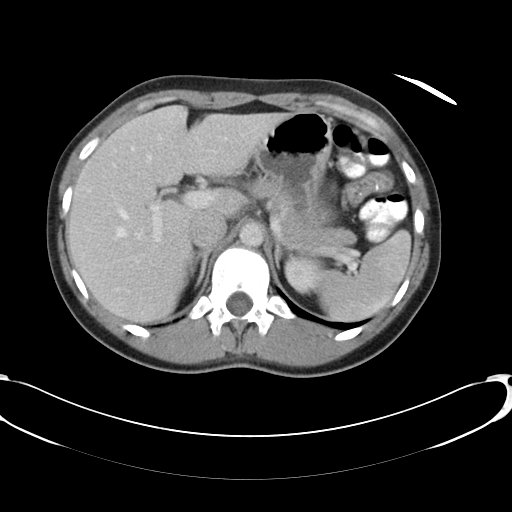
[im 85/98  soft-tissue]
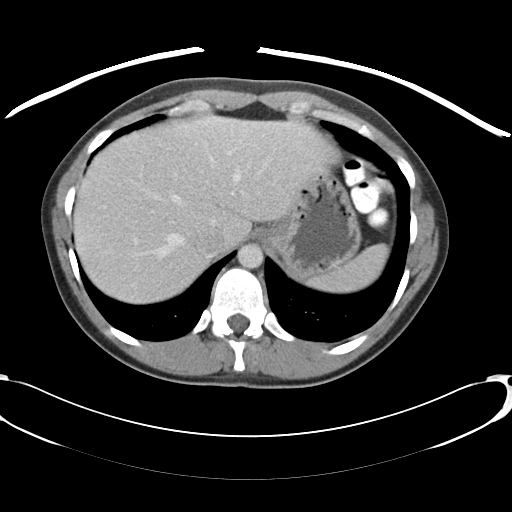
[im 93/98  soft-tissue]
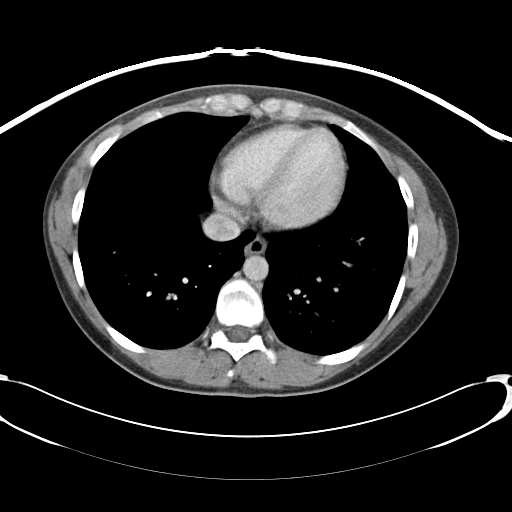

[Series 602: <mpr range> · coronal · 0.98mm/px · 3 of 114 slices shown]
[im 38/114  soft-tissue]
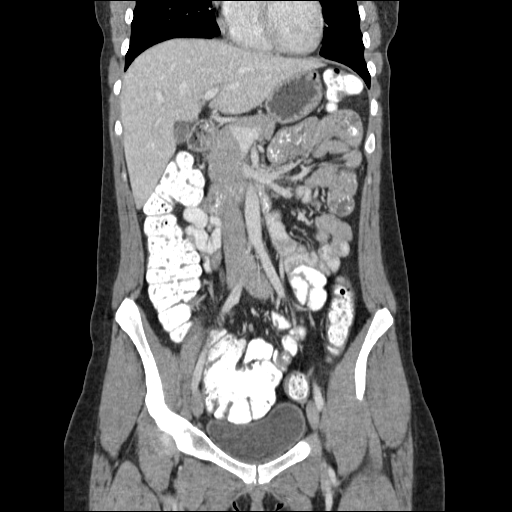
[im 51/114  soft-tissue]
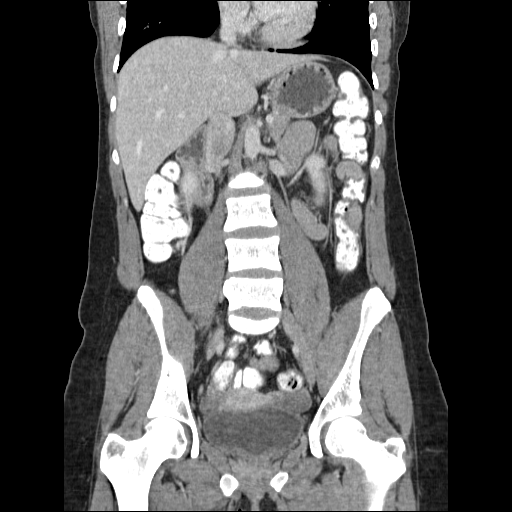
[im 63/114  soft-tissue]
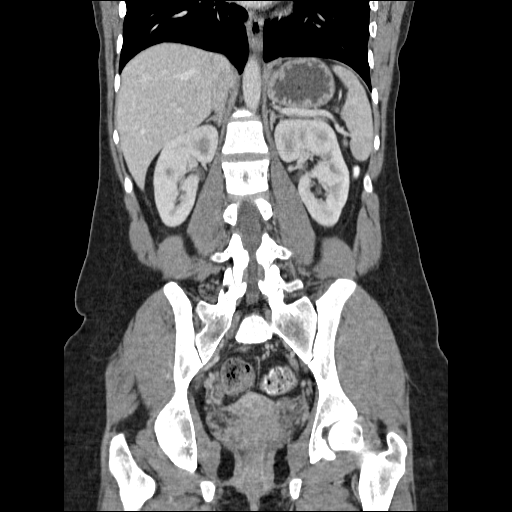

[17 of 46 positions shown; findings below may reference images not displayed]

FINDINGS: Lung bases are clear.  No effusions.  Heart is normal
size.

Focal area of low density around the falciform ligament within the
liver, compatible with focal fatty infiltration.  Liver otherwise
unremarkable.  Gallbladder, spleen, pancreas, adrenals and kidneys
are unremarkable.

Uterus, adnexa and urinary bladder are unremarkable. Bowel grossly
unremarkable.  No free fluid, free air, or adenopathy.  Appendix is
visualized and is normal.

No acute bony abnormality.
IMPRESSION: No acute findings in the abdomen or pelvis.

## 2012-10-27 IMAGING — CR DG CHEST 2V
2 series · 2 of 2 positions shown · non-contrast
Comparison: None.

CLINICAL DATA: Nonproductive cough for a week.  Fever up to
103.Short of breath.

CHEST - 2 VIEW

[w chest pa]
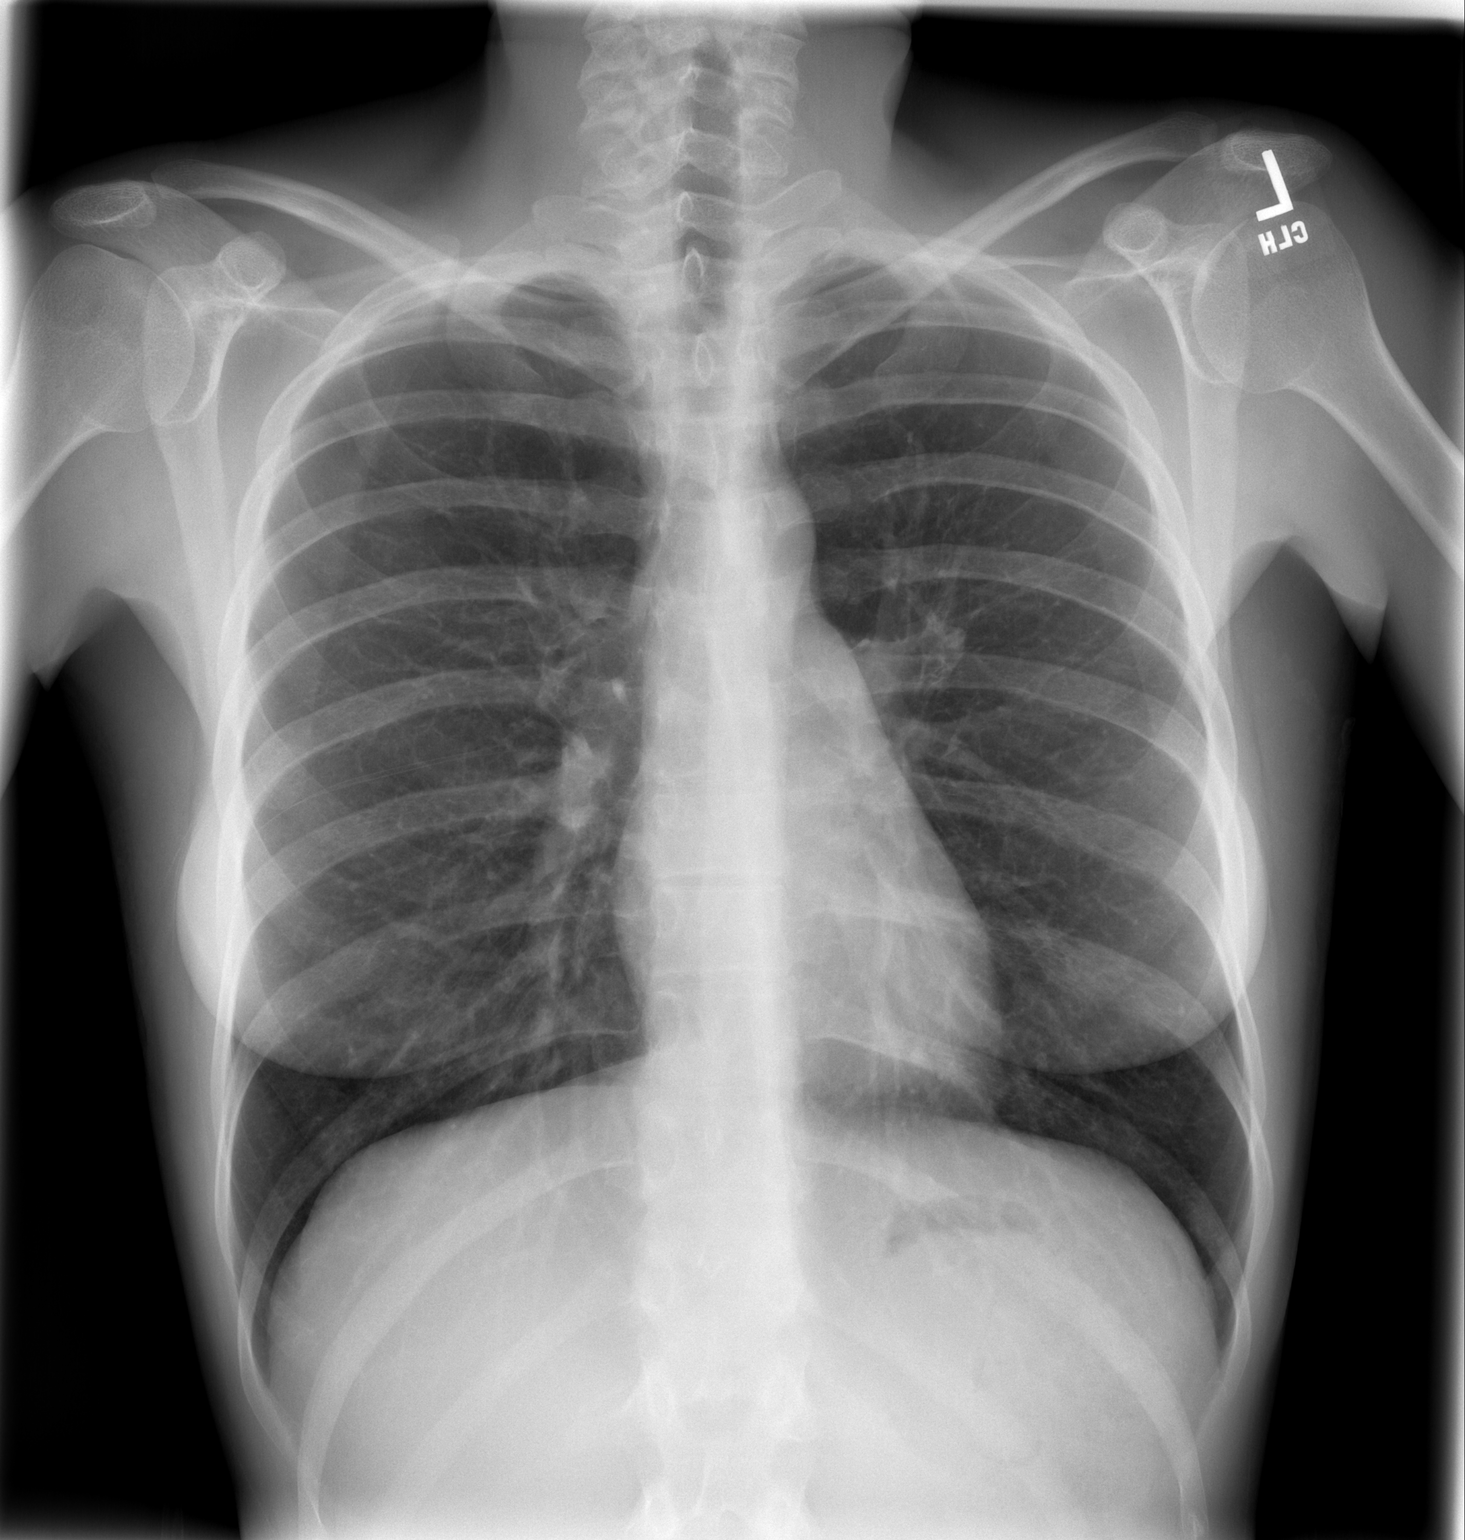

[w chest lat]
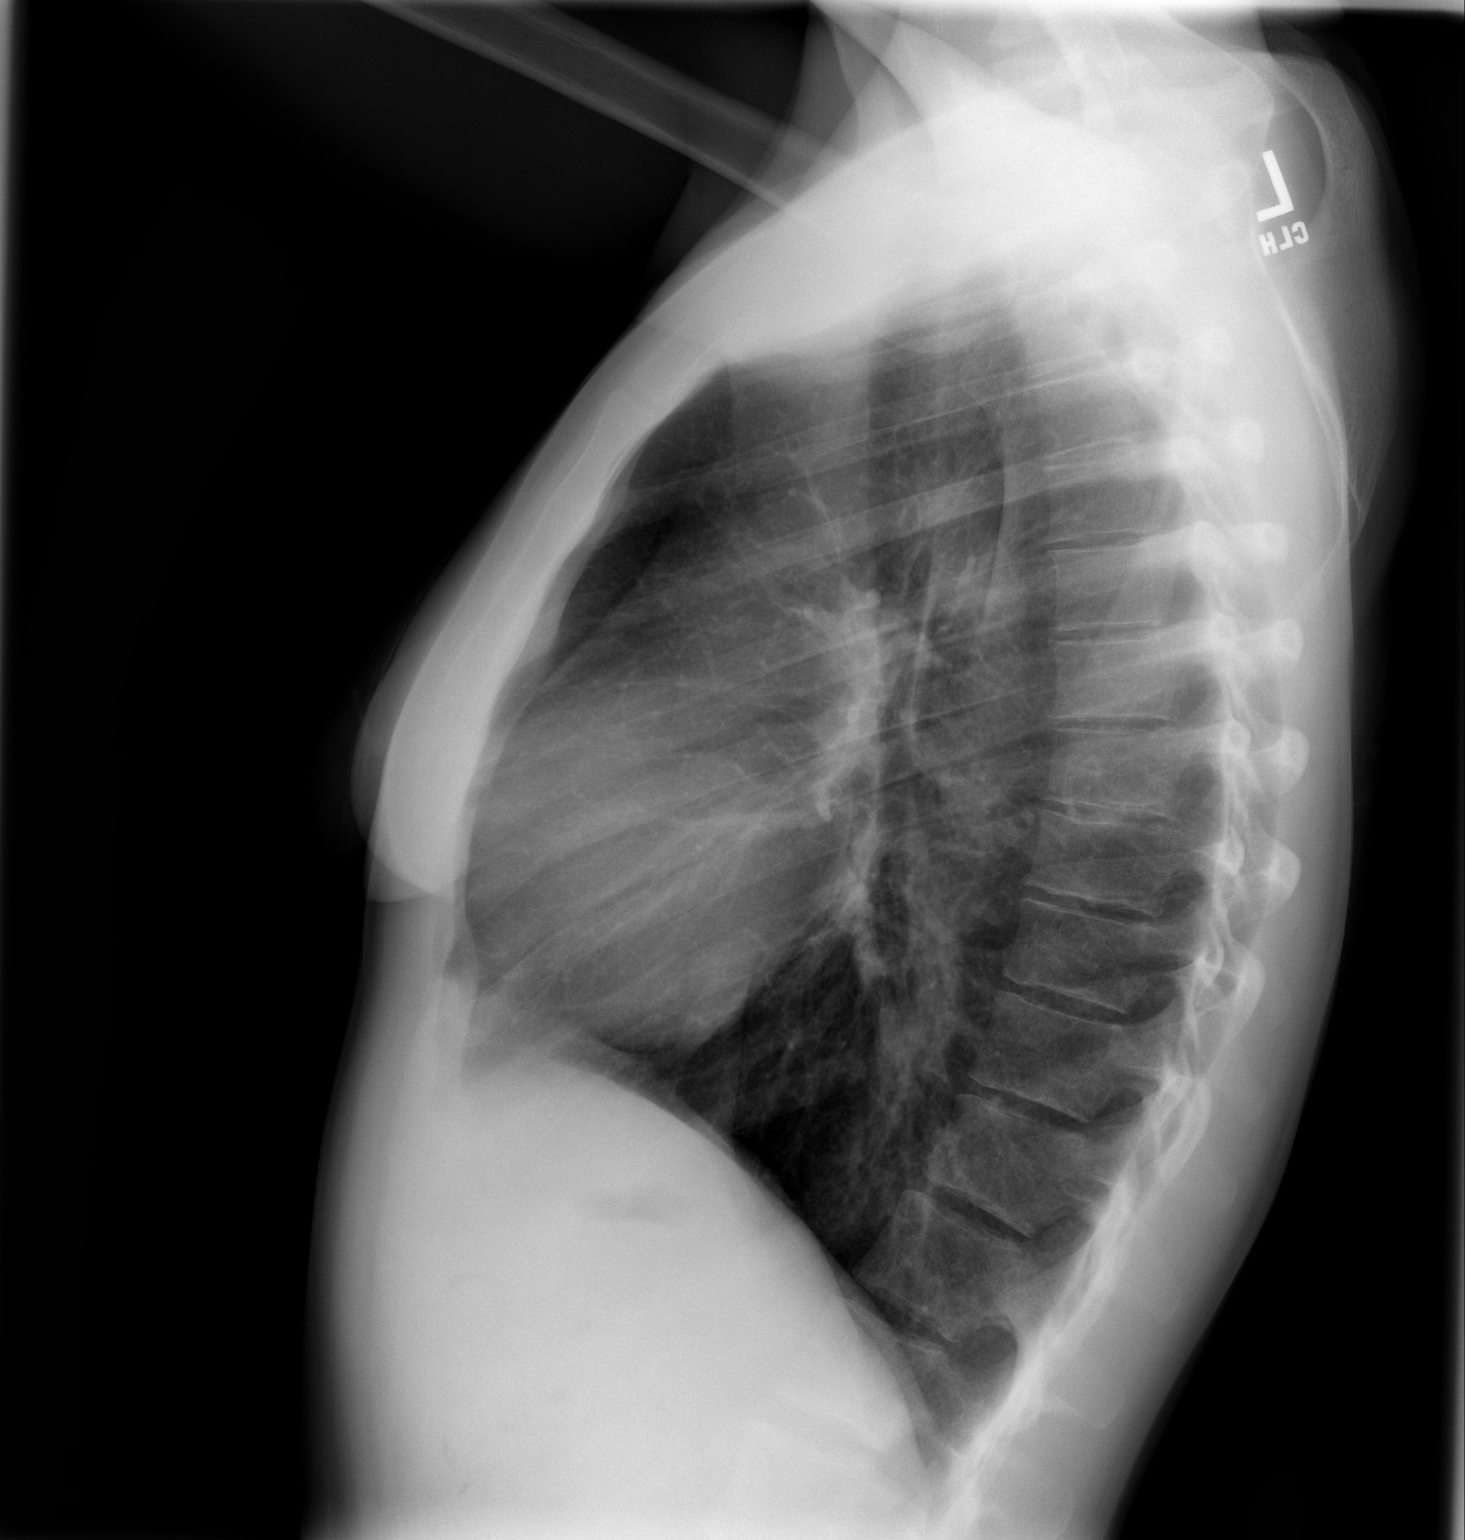

[2 of 2 positions shown; findings below may reference images not displayed]

FINDINGS: The heart size and pulmonary vascularity are normal. The
lungs appear clear and expanded without focal air space disease or
consolidation. No blunting of the costophrenic angles.
IMPRESSION: No evidence of active pulmonary disease.

## 2014-06-03 ENCOUNTER — Encounter: Payer: Self-pay | Admitting: Gastroenterology

## 2017-04-11 ENCOUNTER — Encounter: Payer: Self-pay | Admitting: Physician Assistant

## 2017-04-26 ENCOUNTER — Encounter: Payer: Self-pay | Admitting: Physician Assistant

## 2018-11-19 ENCOUNTER — Encounter: Payer: Self-pay | Admitting: Physician Assistant

## 2018-12-05 ENCOUNTER — Ambulatory Visit: Payer: 59 | Admitting: Physician Assistant

## 2018-12-05 ENCOUNTER — Other Ambulatory Visit: Payer: Self-pay

## 2018-12-05 ENCOUNTER — Encounter: Payer: Self-pay | Admitting: Physician Assistant

## 2018-12-05 ENCOUNTER — Encounter (INDEPENDENT_AMBULATORY_CARE_PROVIDER_SITE_OTHER): Payer: Self-pay

## 2018-12-05 ENCOUNTER — Other Ambulatory Visit (INDEPENDENT_AMBULATORY_CARE_PROVIDER_SITE_OTHER): Payer: 59

## 2018-12-05 VITALS — BP 122/70 | HR 72 | Temp 97.4°F | Ht 72.0 in | Wt 184.6 lb

## 2018-12-05 DIAGNOSIS — K219 Gastro-esophageal reflux disease without esophagitis: Secondary | ICD-10-CM | POA: Diagnosis not present

## 2018-12-05 DIAGNOSIS — R7989 Other specified abnormal findings of blood chemistry: Secondary | ICD-10-CM

## 2018-12-05 DIAGNOSIS — R945 Abnormal results of liver function studies: Secondary | ICD-10-CM

## 2018-12-05 LAB — COMPREHENSIVE METABOLIC PANEL WITH GFR
ALT: 46 U/L — ABNORMAL HIGH (ref 0–35)
AST: 32 U/L (ref 0–37)
Albumin: 5.1 g/dL (ref 3.5–5.2)
Alkaline Phosphatase: 49 U/L (ref 39–117)
BUN: 16 mg/dL (ref 6–23)
CO2: 29 meq/L (ref 19–32)
Calcium: 9.6 mg/dL (ref 8.4–10.5)
Chloride: 103 meq/L (ref 96–112)
Creatinine, Ser: 0.81 mg/dL (ref 0.40–1.20)
GFR: 81.36 mL/min
Glucose, Bld: 94 mg/dL (ref 70–99)
Potassium: 4.2 meq/L (ref 3.5–5.1)
Sodium: 138 meq/L (ref 135–145)
Total Bilirubin: 0.4 mg/dL (ref 0.2–1.2)
Total Protein: 7.5 g/dL (ref 6.0–8.3)

## 2018-12-05 LAB — CBC WITH DIFFERENTIAL/PLATELET
Basophils Absolute: 0.1 10*3/uL (ref 0.0–0.1)
Basophils Relative: 1.7 % (ref 0.0–3.0)
Eosinophils Absolute: 0 10*3/uL (ref 0.0–0.7)
Eosinophils Relative: 0.6 % (ref 0.0–5.0)
HCT: 43.4 % (ref 36.0–46.0)
Hemoglobin: 14.8 g/dL (ref 12.0–15.0)
Lymphocytes Relative: 42.6 % (ref 12.0–46.0)
Lymphs Abs: 1.8 10*3/uL (ref 0.7–4.0)
MCHC: 34.1 g/dL (ref 30.0–36.0)
MCV: 100 fl (ref 78.0–100.0)
Monocytes Absolute: 0.5 10*3/uL (ref 0.1–1.0)
Monocytes Relative: 10.8 % (ref 3.0–12.0)
Neutro Abs: 1.9 10*3/uL (ref 1.4–7.7)
Neutrophils Relative %: 44.3 % (ref 43.0–77.0)
Platelets: 340 10*3/uL (ref 150.0–400.0)
RBC: 4.34 Mil/uL (ref 3.87–5.11)
RDW: 12.7 % (ref 11.5–15.5)
WBC: 4.3 10*3/uL (ref 4.0–10.5)

## 2018-12-05 LAB — IBC + FERRITIN
Ferritin: 85.9 ng/mL (ref 10.0–291.0)
Iron: 108 ug/dL (ref 42–145)
Saturation Ratios: 27.8 % (ref 20.0–50.0)
Transferrin: 277 mg/dL (ref 212.0–360.0)

## 2018-12-05 MED ORDER — OMEPRAZOLE 40 MG PO CPDR: 40.0000 mg | DELAYED_RELEASE_CAPSULE | Freq: Every day | ORAL | 11 refills | Status: AC

## 2018-12-05 NOTE — Patient Instructions (Signed)
If you are age 33 or older, your body mass index should be between 23-30. Your Body mass index is 25.04 kg/m. If this is out of the aforementioned range listed, please consider follow up with your Primary Care Provider.  If you are age 41 or younger, your body mass index should be between 19-25. Your Body mass index is 25.04 kg/m. If this is out of the aformentioned range listed, please consider follow up with your Primary Care Provider.   Your provider has requested that you go to the basement level for lab work before leaving today. Press "B" on the elevator. The lab is located at the first door on the left as you exit the elevator.  We have sent the following medications to your pharmacy for you to pick up at your convenience: Omeprazole  You have been scheduled for an abdominal ultrasound at [facility] (1st floor of hospital) on 12/12/18 at 10:15. Please arrive 15 minutes prior to your appointment for registration. Make certain not to have anything to eat or drink after midnight the day prior to your appointment. Should you need to reschedule your appointment, please contact radiology at [phone]. This test typically takes about 30 minutes to perform.  Strict anti-reflux regimen.  See handout.  We will call you with results.  Thank you for choosing me and [facility].   Amy Esterwood, PA-C

## 2018-12-06 ENCOUNTER — Encounter: Payer: Self-pay | Admitting: Physician Assistant

## 2018-12-06 NOTE — Progress Notes (Signed)
Subjective:    Patient ID: Carol Cain, female    DOB: [birth year], 33 y.o.   MRN: [id number]  HPI Carol Cain is a pleasant 33 year old white female, known very remotely to Dr. Ardis Hughs, and last seen in the office in 2011 when she underwent colonoscopy and EGD.  EGD was normal exam including negative small bowel biopsies.  Colonoscopy showed some minimally abnormal mucosa at the terminal ileum, biopsies were unremarkable. Patient had been living in [place] and recently relocated back to [place].  She says she had been on medication over the past few years for acid reflux and recently ran out.  She said well on omeprazole 40 mg p.o. daily this generally controlled her symptoms fairly well but she would still occasionally have nighttime symptoms especially when first lying down.  This would manifest with heartburn late in the evenings and occasionally waking up at night with fluid in her throat.  She says if that occurs it was generally helpful to get up and walk around etc.  She is not having any dysphagia or odynophagia.  She generally does keep the head of her bed elevated though does not always follow an antireflux diet.  She has been on a keto diet recently and feels that has helped her reflux symptoms well. She brought some labs with her from prior GI evaluation done in April 2019 in [place].  This showed hepatitis B surface antibody was nonreactive and therefore not immune, hep C antibody negative CBC was unremarkable. T bili was 1.6 alk phos 94, AST 607/ALT 426 CRP 2.8, ferritin 902.  Labs were repeated about 2 weeks later and LFTs were normal.  Patient says she does not recall ever getting results from these labs and no other work-up was pursued. She does not have any family history of liver disease. Denies any heavy EtOH use. She currently does not have any other GI complaints, specifically no problems with abdominal pain, changes in bowel habits, change in appetite or weight loss.   Review of Systems Pertinent positive and negative review of systems were noted in the above HPI section.  All other review of systems was otherwise negative.  Outpatient Encounter Medications as of 12/05/2018  Medication Sig  . guanFACINE HCl (TENEX PO) Take 10 mg by mouth daily.  Marland Kitchen omeprazole (PRILOSEC) 40 MG capsule Take 1 capsule (40 mg total) by mouth daily. Take 30 minutes before dinner  . [DISCONTINUED] omeprazole (PRILOSEC) 40 MG capsule Take 40 mg by mouth.   No facility-administered encounter medications on file as of 12/05/2018.    Not on File Patient Active Problem List   Diagnosis Date Noted  . GERD 09/22/2009  . WEIGHT LOSS-ABNORMAL 09/22/2009  . HEADACHE 09/22/2009  . NAUSEA AND VOMITING 09/22/2009  . DIARRHEA 09/22/2009  . ABDOMINAL PAIN -GENERALIZED 09/22/2009   Social History   Socioeconomic History  . Marital status: Single    Spouse name: Not on file  . Number of children: Not on file  . Years of education: Not on file  . Highest education level: Not on file  Occupational History  . 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 Financial resource strain: Not on file  . Food insecurity    Worry: Not on file    Inability: Not on file  . Transportation needs    Medical: Not on file    Non-medical: Not on file  Tobacco Use  . 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 Smokeless tobacco: Never Used  Substance and  Sexual Activity  . Alcohol use: Not on file    Comment: 3 drinks weekly   . Drug use: Never  . Sexual activity: Not on file  Lifestyle  . Physical activity    Days per week: Not on file    Minutes per session: Not on file  . Stress: Not on file  Relationships  . Social Herbalist on phone: Not on file    Gets together: Not on file    Attends religious service: Not on file    Active member of club or organization: Not on file    Attends meetings of clubs or organizations: Not on file    Relationship status: Not on file  . Intimate partner  violence    Fear of current or ex partner: Not on file    Emotionally abused: Not on file    Physically abused: Not on file    Forced sexual activity: Not on file  Other Topics Concern  . Not on file  Social History Narrative  . Not on file    [name]'s family history includes Colon polyps in her father.      Objective:    Vitals:   12/05/18 1451  BP: 122/70  Pulse: 72  Temp: (!) 97.4 F (36.3 C)  SpO2: 98%    Physical Exam Well-developed well-nourished white female in no acute distress.  Height, Weight, 184 BMI 25.0  HEENT; nontraumatic normocephalic, EOMI, PER R LA, sclera anicteric. Oropharynx; not examined/mask/Covid Neck; supple, no JVD Cardiovascular; regular rate and rhythm with S1-S2, no murmur rub or gallop Pulmonary; Clear bilaterally Abdomen; soft, nontender, nondistended, no palpable mass or hepatosplenomegaly, bowel sounds are active Rectal; not done today Skin; benign exam, no jaundice rash or appreciable lesions Extremities; no clubbing cyanosis or edema skin warm and dry Neuro/Psych; alert and oriented x4, grossly nonfocal mood and affect appropriate       Assessment & Plan:   #44 33 year old white female with chronic GERD-here to reestablish GI care.  Symptoms generally well controlled with omeprazole 40 mg p.o. daily  #2 review of prior labs from 2019 done elsewhere showed a transaminitis and elevated ferritin.  Repeat LFTs within a few weeks were normal. Etiology not clear.  With significantly elevated ferritin consider underlying hemochromatosis.  #3 hepatitis B surface antibody negative-no immunity to hep B  Plan; Prescription sent for omeprazole 40 mg p.o. daily advised to take [facility] dinner since the majority of her symptoms are nocturnal. We reviewed an antireflux regimen including n.p.o. for 2 to 3 hours prior to bedtime, and antireflux diet  Schedule upper abdominal ultrasound Check CBC, c-Met and iron studies. Further recommendations  pending results of labs and ultrasound.  Amy S Esterwood PA-C 12/06/2018   Cc: No ref. provider found

## 2018-12-07 NOTE — Progress Notes (Signed)
I agree with the above note, plan 

## 2018-12-11 ENCOUNTER — Telehealth: Payer: Self-pay | Admitting: Physician Assistant

## 2018-12-12 ENCOUNTER — Ambulatory Visit (HOSPITAL_COMMUNITY)
Admission: RE | Admit: 2018-12-12 | Discharge: 2018-12-12 | Disposition: A | Discharge status: Home / Self Care | Payer: 59 | Source: Ambulatory Visit | Attending: Physician Assistant | Admitting: Physician Assistant

## 2018-12-12 ENCOUNTER — Other Ambulatory Visit: Payer: Self-pay

## 2018-12-12 DIAGNOSIS — R945 Abnormal results of liver function studies: Secondary | ICD-10-CM | POA: Insufficient documentation

## 2018-12-12 DIAGNOSIS — R7989 Other specified abnormal findings of blood chemistry: Secondary | ICD-10-CM

## 2018-12-17 NOTE — Progress Notes (Signed)
Please call patient and let her know the abdominal ultrasound was negative, specifically liver appears normal

## 2018-12-18 NOTE — Telephone Encounter (Signed)
Spoke with [employer] today.  Omeprazole approved for one year.  Patient aware to check with pharmacy to pick up.

## 2018-12-26 ENCOUNTER — Ambulatory Visit: Payer: 59 | Attending: Internal Medicine

## 2018-12-26 DIAGNOSIS — R238 Other skin changes: Secondary | ICD-10-CM

## 2018-12-26 DIAGNOSIS — U071 COVID-19: Secondary | ICD-10-CM

## 2018-12-27 LAB — NOVEL CORONAVIRUS, NAA: SARS-CoV-2, NAA: NOT DETECTED

## 2019-01-01 ENCOUNTER — Ambulatory Visit: Payer: 59 | Attending: Internal Medicine

## 2019-01-01 DIAGNOSIS — Z20822 Contact with and (suspected) exposure to covid-19: Secondary | ICD-10-CM

## 2019-01-02 LAB — NOVEL CORONAVIRUS, NAA: SARS-CoV-2, NAA: NOT DETECTED
